# Patient Record
Sex: Female | Born: 1985 | Race: White | Hispanic: No | Marital: Married | Smoking: Never smoker
Health system: Southern US, Community
[De-identification: ages and names within clinical notes are randomized; demographics above are authoritative.]

## PROBLEM LIST (undated history)

## (undated) ENCOUNTER — Inpatient Hospital Stay (HOSPITAL_COMMUNITY): Payer: Self-pay

## (undated) DIAGNOSIS — E282 Polycystic ovarian syndrome: Secondary | ICD-10-CM

## (undated) DIAGNOSIS — O24419 Gestational diabetes mellitus in pregnancy, unspecified control: Secondary | ICD-10-CM

## (undated) DIAGNOSIS — E669 Obesity, unspecified: Secondary | ICD-10-CM

## (undated) DIAGNOSIS — O139 Gestational [pregnancy-induced] hypertension without significant proteinuria, unspecified trimester: Secondary | ICD-10-CM

## (undated) DIAGNOSIS — Z8619 Personal history of other infectious and parasitic diseases: Secondary | ICD-10-CM

## (undated) HISTORY — DX: Gestational diabetes mellitus in pregnancy, unspecified control: O24.419

## (undated) HISTORY — PX: NO PAST SURGERIES: SHX2092

## (undated) HISTORY — DX: Obesity, unspecified: E66.9

## (undated) HISTORY — DX: Personal history of other infectious and parasitic diseases: Z86.19

---
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
## 2005-03-12 ENCOUNTER — Ambulatory Visit: Payer: Self-pay | Admitting: Family Medicine

## 2005-05-21 ENCOUNTER — Ambulatory Visit: Payer: Self-pay | Admitting: Family Medicine

## 2005-08-27 ENCOUNTER — Ambulatory Visit: Payer: Self-pay | Admitting: Family Medicine

## 2007-12-28 ENCOUNTER — Encounter: Admission: RE | Admit: 2007-12-28 | Discharge: 2007-12-28 | Payer: Self-pay | Admitting: Occupational Medicine

## 2008-08-10 IMAGING — CR DG ANKLE COMPLETE 3+V*R*
3 series · 3 of 3 positions shown · non-contrast
Comparison: None available

CLINICAL DATA: FALL, INJURY

RIGHT ANKLE - COMPLETE 3+ VIEW

[view not recorded (1 of 3)]
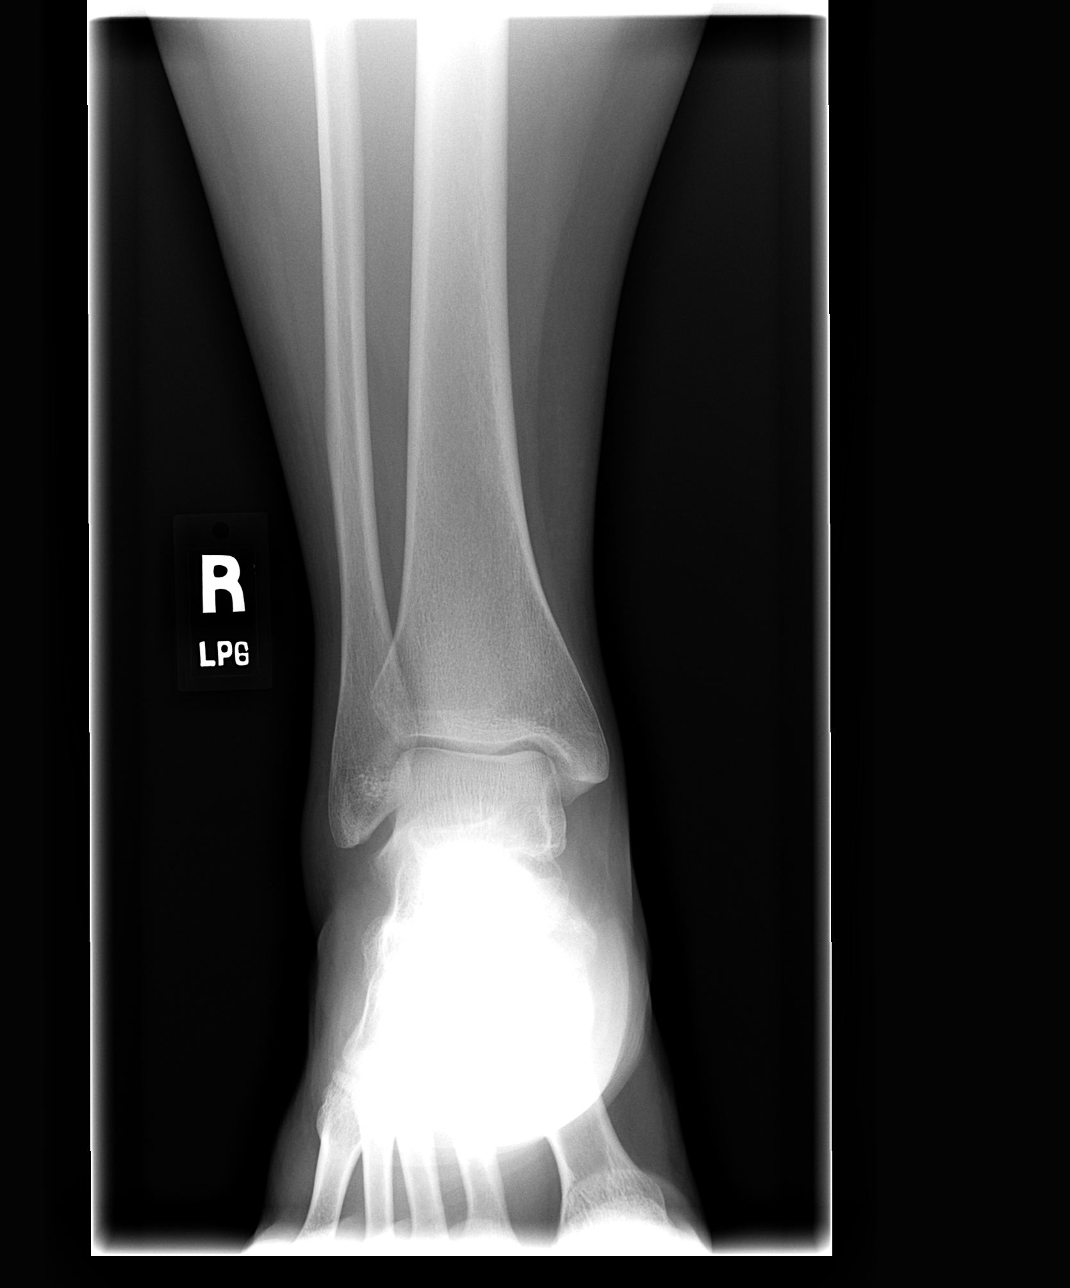

[view not recorded (2 of 3)]
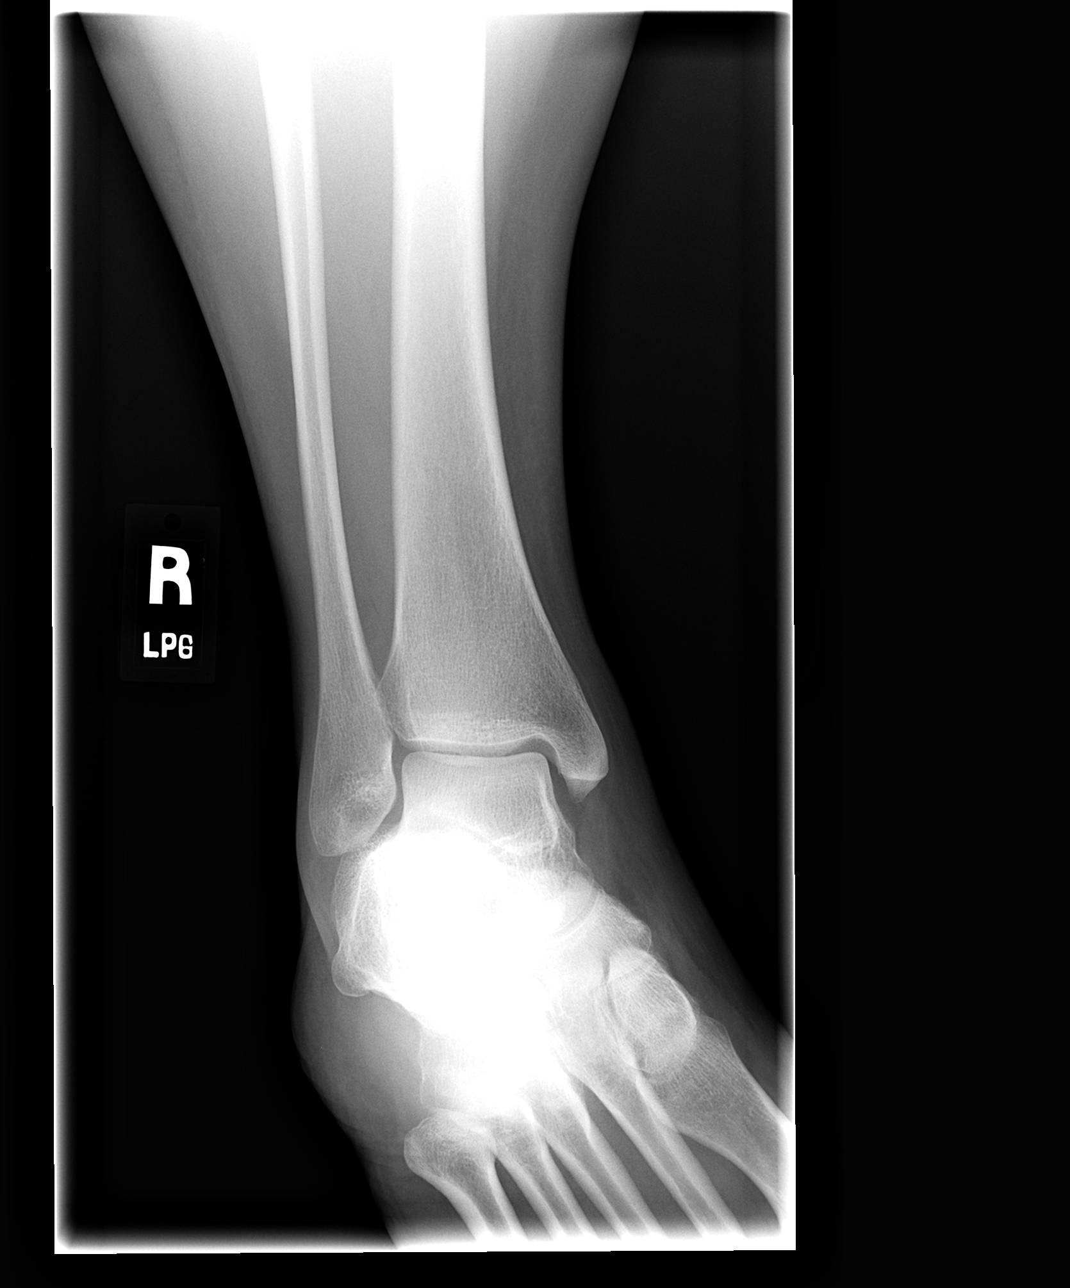

[view not recorded (3 of 3)]
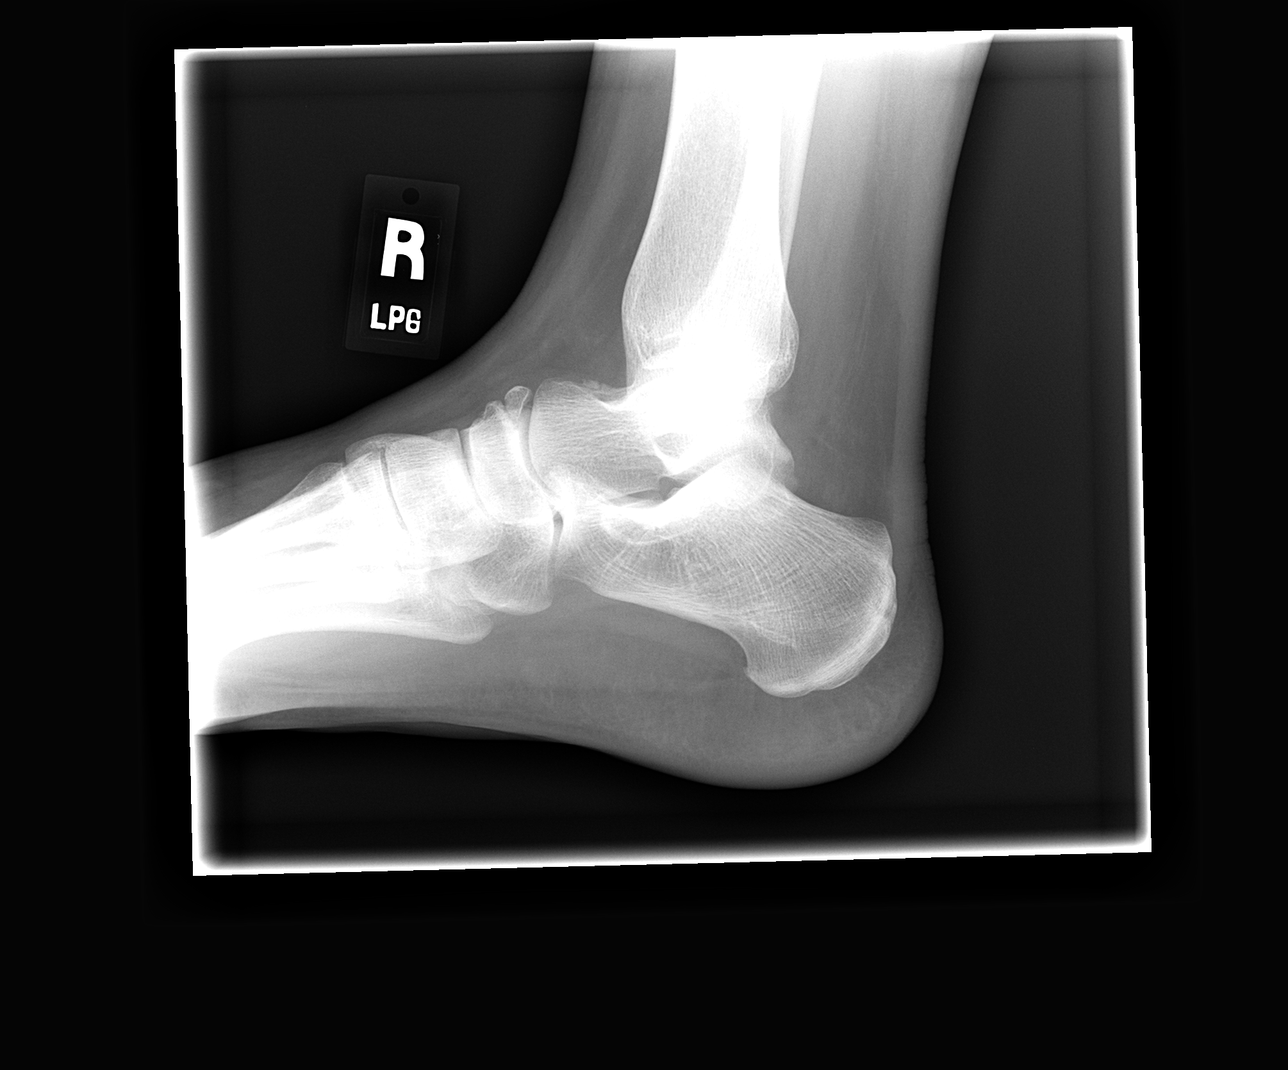

[3 of 3 positions shown; findings below may reference images not displayed]

FINDINGS: Ankle mortise congruent.  Medial and lateral malleolus
intact.

Two  abnormalities are noted on the lateral view.  The first is a
small accessory ossicle which is well corticated at the the
superior aspect of the navicular bone, consistent with os
infranaviculaire.  There is small area of bony irregularity at the
talar ridge which could represent a small capsular avulsion.
Recommend clinical correlation for site of tenderness.
IMPRESSION: 1.  Bony irregularity of the talar ridge could represent small
capsular avulsion.  Clinical correlation required.

## 2011-05-13 LAB — RPR: RPR: NONREACTIVE

## 2011-05-13 LAB — ANTIBODY SCREEN: Antibody Screen: NEGATIVE

## 2011-05-13 LAB — GC/CHLAMYDIA PROBE AMP, GENITAL: Chlamydia: NEGATIVE

## 2011-05-13 LAB — RUBELLA ANTIBODY, IGM: Rubella: IMMUNE

## 2011-05-20 ENCOUNTER — Other Ambulatory Visit: Payer: Self-pay

## 2011-06-22 ENCOUNTER — Other Ambulatory Visit (HOSPITAL_COMMUNITY): Payer: Self-pay | Admitting: Obstetrics and Gynecology

## 2011-06-22 DIAGNOSIS — Z3689 Encounter for other specified antenatal screening: Secondary | ICD-10-CM

## 2011-07-22 ENCOUNTER — Ambulatory Visit (HOSPITAL_COMMUNITY)
Admission: RE | Admit: 2011-07-22 | Discharge: 2011-07-22 | Disposition: A | Discharge status: Home / Self Care | Payer: Commercial Managed Care - PPO | Source: Ambulatory Visit | Attending: Obstetrics and Gynecology | Admitting: Obstetrics and Gynecology

## 2011-07-22 DIAGNOSIS — Z1389 Encounter for screening for other disorder: Secondary | ICD-10-CM | POA: Insufficient documentation

## 2011-07-22 DIAGNOSIS — Z363 Encounter for antenatal screening for malformations: Secondary | ICD-10-CM | POA: Insufficient documentation

## 2011-07-22 DIAGNOSIS — Z3689 Encounter for other specified antenatal screening: Secondary | ICD-10-CM

## 2011-07-22 DIAGNOSIS — O358XX0 Maternal care for other (suspected) fetal abnormality and damage, not applicable or unspecified: Secondary | ICD-10-CM | POA: Insufficient documentation

## 2011-10-05 NOTE — L&D Delivery Note (Signed)
Delivery Note At 4:12 AM a viable female was delivered via Vaginal, Spontaneous Delivery (Presentation: Right Occiput Anterior).  APGAR: 9,9 ; weight 7 lb 12 oz (3515 g).   Placenta status: spontaneous ,intact  . 3V Cord  Pt Delivered a vigorous female infant in the vertex ROA presentation weighing 7#12 with apgars of 9/9. Placenta delivered spontaneously, intact with 3VC.  No lacerations required repair.  Mother and baby are doing well after delivery.  Anesthesia: Epidural  Episiotomy: None Lacerations: None Suture Repair: None Est. Blood Loss (mL): 300 cc  Mom to postpartum.  Baby to nursery-stable.  Osbaldo Mark H. 12/16/2011, 4:36 AM

## 2011-10-20 ENCOUNTER — Encounter: Payer: Commercial Managed Care - PPO | Attending: Obstetrics and Gynecology | Admitting: *Deleted

## 2011-10-20 ENCOUNTER — Encounter: Payer: Self-pay | Admitting: *Deleted

## 2011-10-20 DIAGNOSIS — Z713 Dietary counseling and surveillance: Secondary | ICD-10-CM | POA: Insufficient documentation

## 2011-10-20 DIAGNOSIS — O99810 Abnormal glucose complicating pregnancy: Secondary | ICD-10-CM | POA: Insufficient documentation

## 2011-10-20 NOTE — Patient Instructions (Signed)
Goals:  Check glucose levels per MD as instructed  Follow Gestational Diabetes Diet as instructed  Call for follow-up as needed    

## 2011-10-20 NOTE — Progress Notes (Signed)
  Patient was seen on 10/20/2011 for Gestational Diabetes self-management class at the [facility]. The following learning objectives were met by the patient during this course:   States the definition of Gestational Diabetes  States why dietary management is important in controlling blood glucose  Describes the effects each nutrient has on blood glucose levels  Demonstrates ability to create a balanced meal plan  Demonstrates carbohydrate counting   States when to check blood glucose levels  Demonstrates proper blood glucose monitoring techniques  States the effect of stress and exercise on blood glucose levels  States the importance of limiting caffeine and abstaining from alcohol and smoking  Blood glucose monitor given: One Touch Ultra Mini Lot # [id number] Exp: 02/2012 Blood glucose reading: 136 post meal  Patient instructed to monitor glucose levels: FBS: 60 - <90 2 hour: <120  *Patient received handouts:  Nutrition Diabetes and Pregnancy  Carbohydrate Counting List  Patient will be seen for follow-up as needed.

## 2011-11-10 ENCOUNTER — Other Ambulatory Visit (HOSPITAL_COMMUNITY): Payer: Self-pay | Admitting: Obstetrics and Gynecology

## 2011-11-12 ENCOUNTER — Encounter (HOSPITAL_COMMUNITY): Payer: Self-pay

## 2011-11-12 ENCOUNTER — Other Ambulatory Visit (HOSPITAL_COMMUNITY): Payer: Self-pay | Admitting: Obstetrics and Gynecology

## 2011-11-12 ENCOUNTER — Ambulatory Visit (HOSPITAL_COMMUNITY)
Admission: RE | Admit: 2011-11-12 | Discharge: 2011-11-12 | Disposition: A | Discharge status: Home / Self Care | Payer: Commercial Managed Care - PPO | Source: Ambulatory Visit | Attending: Obstetrics and Gynecology | Admitting: Obstetrics and Gynecology

## 2011-11-12 DIAGNOSIS — E669 Obesity, unspecified: Secondary | ICD-10-CM | POA: Insufficient documentation

## 2011-11-12 DIAGNOSIS — O368390 Maternal care for abnormalities of the fetal heart rate or rhythm, unspecified trimester, not applicable or unspecified: Secondary | ICD-10-CM | POA: Insufficient documentation

## 2011-11-12 DIAGNOSIS — O99810 Abnormal glucose complicating pregnancy: Secondary | ICD-10-CM | POA: Insufficient documentation

## 2011-11-12 DIAGNOSIS — O09299 Supervision of pregnancy with other poor reproductive or obstetric history, unspecified trimester: Secondary | ICD-10-CM | POA: Insufficient documentation

## 2011-11-12 HISTORY — DX: Polycystic ovarian syndrome: E28.2

## 2011-11-12 NOTE — Progress Notes (Signed)
Ms. Varma was seen for ultrasound appointment today.  Please see [facility] report for details.

## 2011-11-18 LAB — STREP B DNA PROBE: GBS: NEGATIVE

## 2011-12-11 ENCOUNTER — Encounter (HOSPITAL_COMMUNITY): Payer: Self-pay | Admitting: *Deleted

## 2011-12-11 ENCOUNTER — Inpatient Hospital Stay (HOSPITAL_COMMUNITY)
Admission: AD | Admit: 2011-12-11 | Discharge: 2011-12-12 | Disposition: A | Discharge status: Home / Self Care | Payer: Commercial Managed Care - PPO | Attending: Obstetrics and Gynecology | Admitting: Obstetrics and Gynecology

## 2011-12-11 DIAGNOSIS — O479 False labor, unspecified: Secondary | ICD-10-CM | POA: Insufficient documentation

## 2011-12-11 HISTORY — DX: Gestational (pregnancy-induced) hypertension without significant proteinuria, unspecified trimester: O13.9

## 2011-12-11 NOTE — Progress Notes (Signed)
"  I had a doctor's appt yesterday and she told me I'm dilated 4cm already.  I started having UC's at around 1300 today.  They are about every 7 mins.  I'm not feeling very uncomforable with them.  We live over an hour away and Dr. Henderson Cloud told me to come get checked out.  No bleeding or leaking fluid, but I am starting to lose my mucous plug.  (+) FM  She hasn't moved as much as normal in the last couple of hours, but she just moved a couple of hours ago."

## 2011-12-15 ENCOUNTER — Encounter (HOSPITAL_COMMUNITY): Payer: Self-pay | Admitting: Anesthesiology

## 2011-12-15 ENCOUNTER — Encounter (HOSPITAL_COMMUNITY): Payer: Self-pay

## 2011-12-15 ENCOUNTER — Inpatient Hospital Stay (HOSPITAL_COMMUNITY): Payer: Commercial Managed Care - PPO | Admitting: Anesthesiology

## 2011-12-15 ENCOUNTER — Encounter (HOSPITAL_COMMUNITY): Payer: Self-pay | Admitting: *Deleted

## 2011-12-15 ENCOUNTER — Inpatient Hospital Stay (HOSPITAL_COMMUNITY)
Admission: RE | Admit: 2011-12-15 | Discharge: 2011-12-18 | DRG: 775 | Disposition: A | Discharge status: Home / Self Care | Payer: Commercial Managed Care - PPO | Source: Ambulatory Visit | Attending: Obstetrics and Gynecology | Admitting: Obstetrics and Gynecology

## 2011-12-15 ENCOUNTER — Telehealth (HOSPITAL_COMMUNITY): Payer: Self-pay | Admitting: *Deleted

## 2011-12-15 DIAGNOSIS — E669 Obesity, unspecified: Secondary | ICD-10-CM | POA: Diagnosis present

## 2011-12-15 DIAGNOSIS — O99814 Abnormal glucose complicating childbirth: Principal | ICD-10-CM | POA: Diagnosis present

## 2011-12-15 LAB — CBC
MCH: 25.3 pg — ABNORMAL LOW (ref 26.0–34.0)
MCV: 78.2 fL (ref 78.0–100.0)
Platelets: 229 10*3/uL (ref 150–400)
RDW: 13.8 % (ref 11.5–15.5)
WBC: 10.3 10*3/uL (ref 4.0–10.5)

## 2011-12-15 MED ORDER — EPHEDRINE 5 MG/ML INJ: 10.0000 mg | INTRAVENOUS | Status: DC | PRN

## 2011-12-15 MED ORDER — LACTATED RINGERS IV SOLN: 500.0000 mL | Freq: Once | INTRAVENOUS | Status: DC

## 2011-12-15 MED ORDER — FENTANYL 2.5 MCG/ML BUPIVACAINE 1/10 % EPIDURAL INFUSION (WH - ANES)
INTRAMUSCULAR | Status: DC | PRN
  Administered 2011-12-15: 14 mL/h via EPIDURAL

## 2011-12-15 MED ORDER — CITRIC ACID-SODIUM CITRATE 334-500 MG/5ML PO SOLN: 30.0000 mL | ORAL | Status: DC | PRN

## 2011-12-15 MED ORDER — PHENYLEPHRINE 40 MCG/ML (10ML) SYRINGE FOR IV PUSH (FOR BLOOD PRESSURE SUPPORT)
80.0000 ug | PREFILLED_SYRINGE | INTRAVENOUS | Status: DC | PRN
  Filled 2011-12-15: qty 5

## 2011-12-15 MED ORDER — FENTANYL 2.5 MCG/ML BUPIVACAINE 1/10 % EPIDURAL INFUSION (WH - ANES)
14.0000 mL/h | INTRAMUSCULAR | Status: DC
  Administered 2011-12-16 (×2): 14 mL/h via EPIDURAL
  Filled 2011-12-15 (×3): qty 60

## 2011-12-15 MED ORDER — OXYCODONE-ACETAMINOPHEN 5-325 MG PO TABS: 1.0000 | ORAL_TABLET | ORAL | Status: DC | PRN

## 2011-12-15 MED ORDER — OXYTOCIN 20 UNITS IN LACTATED RINGERS INFUSION - SIMPLE
1.0000 m[IU]/min | INTRAVENOUS | Status: DC
  Administered 2011-12-15: 2 m[IU]/min via INTRAVENOUS
  Filled 2011-12-15: qty 1000

## 2011-12-15 MED ORDER — LIDOCAINE-EPINEPHRINE (PF) 2 %-1:200000 IJ SOLN
INTRAMUSCULAR | Status: DC | PRN
  Administered 2011-12-15: 4 mL via EPIDURAL

## 2011-12-15 MED ORDER — LIDOCAINE HCL (PF) 1 % IJ SOLN: 30.0000 mL | INTRAMUSCULAR | Status: DC | PRN

## 2011-12-15 MED ORDER — OXYTOCIN BOLUS FROM INFUSION
500.0000 mL | Freq: Once | INTRAVENOUS | Status: DC
  Filled 2011-12-15: qty 500

## 2011-12-15 MED ORDER — DIPHENHYDRAMINE HCL 50 MG/ML IJ SOLN
12.5000 mg | INTRAMUSCULAR | Status: DC | PRN
  Administered 2011-12-16: 12.5 mg via INTRAVENOUS
  Filled 2011-12-15: qty 1

## 2011-12-15 MED ORDER — TERBUTALINE SULFATE 1 MG/ML IJ SOLN: 0.2500 mg | Freq: Once | INTRAMUSCULAR | Status: AC | PRN

## 2011-12-15 MED ORDER — LACTATED RINGERS IV SOLN
INTRAVENOUS | Status: DC
  Administered 2011-12-15 – 2011-12-16 (×2): via INTRAVENOUS

## 2011-12-15 MED ORDER — FLEET ENEMA 7-19 GM/118ML RE ENEM: 1.0000 | ENEMA | RECTAL | Status: DC | PRN

## 2011-12-15 MED ORDER — EPHEDRINE 5 MG/ML INJ
10.0000 mg | INTRAVENOUS | Status: DC | PRN
  Filled 2011-12-15 (×2): qty 4

## 2011-12-15 MED ORDER — LACTATED RINGERS IV SOLN: 500.0000 mL | INTRAVENOUS | Status: DC | PRN

## 2011-12-15 MED ORDER — ONDANSETRON HCL 4 MG/2ML IJ SOLN
4.0000 mg | Freq: Four times a day (QID) | INTRAMUSCULAR | Status: DC | PRN
  Administered 2011-12-15: 4 mg via INTRAVENOUS
  Filled 2011-12-15: qty 2

## 2011-12-15 MED ORDER — OXYTOCIN 20 UNITS IN LACTATED RINGERS INFUSION - SIMPLE
125.0000 mL/h | Freq: Once | INTRAVENOUS | Status: AC
  Administered 2011-12-16: 999 mL/h via INTRAVENOUS

## 2011-12-15 MED ORDER — IBUPROFEN 600 MG PO TABS: 600.0000 mg | ORAL_TABLET | Freq: Four times a day (QID) | ORAL | Status: DC | PRN

## 2011-12-15 MED ORDER — ACETAMINOPHEN 325 MG PO TABS: 650.0000 mg | ORAL_TABLET | ORAL | Status: DC | PRN

## 2011-12-15 NOTE — Anesthesia Preprocedure Evaluation (Addendum)
Anesthesia Evaluation  Patient identified by MRN, date of birth, ID band Patient awake    Reviewed: Allergy & Precautions, H&P , Patient's Chart, lab work & pertinent test results  Airway Mallampati: II TM Distance: >3 FB Neck ROM: full    Dental  (+) Teeth Intact   Pulmonary  breath sounds clear to auscultation        Cardiovascular Rhythm:regular Rate:Normal     Neuro/Psych    GI/Hepatic   Endo/Other  Diabetes mellitus-, GestationalMorbid obesity  Renal/GU      Musculoskeletal   Abdominal   Peds  Hematology   Anesthesia Other Findings       Reproductive/Obstetrics (+) Pregnancy                          Anesthesia Physical Anesthesia Plan  ASA: III  Anesthesia Plan: Epidural   Post-op Pain Management:    Induction:   Airway Management Planned:   Additional Equipment:   Intra-op Plan:   Post-operative Plan:   Informed Consent: I have reviewed the patients History and Physical, chart, labs and discussed the procedure including the risks, benefits and alternatives for the proposed anesthesia with the patient or authorized representative who has indicated his/her understanding and acceptance.   Dental Advisory Given  Plan Discussed with:   Anesthesia Plan Comments: (Labs checked- platelets confirmed with RN in room. Fetal heart tracing, per RN, reported to be stable enough for sitting procedure. Discussed epidural, and patient consents to the procedure:  included risk of possible headache,backache, failed block, allergic reaction, and nerve injury. This patient was asked if she had any questions or concerns before the procedure started. )        Anesthesia Quick Evaluation

## 2011-12-15 NOTE — Progress Notes (Signed)
Chair without wheels offered to support person during epidural placement and declined.

## 2011-12-15 NOTE — H&P (Signed)
  Admission H&P  CC: IOL HPI: 26 year old G2 P1001 presents at 38 weeks +6 days for a scheduled induction of labor. The patient is an A2 gestational diabetic has been controlled on glyburide throughout this pregnancy.  She presents this evening for a scheduled induction of labor for gestational diabetes. Additionally her pregnancy has been complicated by maternal obesity with a early pregnancy BMI of 41.6. At 33 weeks during this pregnancy she was noted to have a fetal arrhythmia. The patient was referred to maternal fetal medicine. An ultrasound at that time showed normal fetal anatomy with no evidence of fetal arrhythmia. She has had no further issues with this fetal arrhythmia since that evaluation.  She's been followed with serial growth ultrasounds and nonstress test. Her most recent estimated fetal weight is 75th percentile.  PMH: 1) PCOS with oligoovulation requiring clomid + metformin 2) Obesity PSH: None POBGYN: G2P1001  2007: 37 week svd female 6#14, Induction for PIH Meds: 1) Glyburide 2) PNV All: Stadol SH: Neg x 3 PNL: blood type O+, antibody screen negative, RPR nonreactive, rubella titer immune, hepatitis B surface antigen negative, HIV nonreactive, Pap smear within normal limits, gonorrhea negative, Chlamydia negative, one-hour Glucola 142, three-hour Glucola 90, 199, 139, 184. GBS negative. Declines genetic screening. PE: CBG 110 Filed Vitals:   12/15/11 2138 12/15/11 2139 12/15/11 2140 12/15/11 2141  BP: 144/125 148/79 125/109   Pulse: 99 99 100 104  Temp:      TempSrc:      Resp:      Height:      Weight:       AOX3, NAD Gravid, soft, NT Dilation: 4 Effacement (%): 50 Cervical Position: Posterior Station: -2 Presentation: Vertex Exam by:: Bennye Alm, RN FHT:140s reactive, with accelerations, and no decelerations Toco: Irregular  A/P 1) Admit 2) Place epidural 3) AROM/Pitocin 4) Accuchecks Q4 while in latent labor, Q1 in active labor

## 2011-12-15 NOTE — Anesthesia Procedure Notes (Signed)
Epidural Patient location during procedure: OB  Preanesthetic Checklist Completed: patient identified, site marked, surgical consent, pre-op evaluation, timeout performed, IV checked, risks and benefits discussed and monitors and equipment checked  Epidural Patient position: sitting Prep: site prepped and draped and DuraPrep Patient monitoring: continuous pulse ox and blood pressure Approach: midline Injection technique: LOR air  Needle:  Needle type: Tuohy  Needle gauge: 17 G Needle length: 9 cm Needle insertion depth: 9 cm Catheter type: closed end flexible Catheter size: 19 Gauge Test dose: negative  Assessment Events: blood not aspirated, injection not painful, no injection resistance, negative IV test and no paresthesia  Additional Notes Dosing of Epidural:  1st dose, through needle ............................................. epi 1:200K + Xylocaine 40 mg  2nd dose, through catheter, after waiting 3 minutes.....epi 1:200K + Xylocaine 40 mg  3rd dose, through catheter after waiting 3 minutes .............................Marcaine   4mg   ( mg Marcaine are expressed as equivilent  cc's medication removed from the 0.1%Bupiv / fentanyl syringe from L&D pump)  ( 2% Xylo charted as a single dose in Epic Meds for ease of charting; actual dosing was fractionated as above, for saftey's sake)  As each dose occurred, patient was free of IV sx; and patient exhibited no evidence of SA injection.  Patient is more comfortable after epidural dosed. Please see RN's note for documentation of vital signs,and FHR which are stable.    

## 2011-12-15 NOTE — Telephone Encounter (Signed)
Preadmission screen  

## 2011-12-15 NOTE — Progress Notes (Signed)
Patient ID: Patricia Bailey, female   DOB: 09/11/1986, 26 y.o.   MRN: [id number]  Labor PN  S: Comfortable after epidural O:  Filed Vitals:   12/15/11 2141 12/15/11 2144 12/15/11 2145 12/15/11 2146  BP:   140/104 155/86  Pulse: 104 96 97 103  Temp:      TempSrc:      Resp:      Height:      Weight:       Cervix 4+/70/-2 soft FHT 145 + accels, no decels, reactive toco irregular q 2-3  AROM clear  A/P 1) Cont pit 2) FWB reassuring

## 2011-12-16 ENCOUNTER — Encounter (HOSPITAL_COMMUNITY): Payer: Self-pay

## 2011-12-16 LAB — GLUCOSE, CAPILLARY: Glucose-Capillary: 113 mg/dL — ABNORMAL HIGH (ref 70–99)

## 2011-12-16 LAB — ABO/RH: ABO/RH(D): O POS

## 2011-12-16 MED ORDER — ZOLPIDEM TARTRATE 5 MG PO TABS: 5.0000 mg | ORAL_TABLET | Freq: Every evening | ORAL | Status: DC | PRN

## 2011-12-16 MED ORDER — BENZOCAINE-MENTHOL 20-0.5 % EX AERO
INHALATION_SPRAY | CUTANEOUS | Status: AC
  Filled 2011-12-16: qty 56

## 2011-12-16 MED ORDER — IBUPROFEN 600 MG PO TABS
600.0000 mg | ORAL_TABLET | Freq: Four times a day (QID) | ORAL | Status: DC
  Administered 2011-12-16 – 2011-12-18 (×9): 600 mg via ORAL
  Filled 2011-12-16 (×8): qty 1

## 2011-12-16 MED ORDER — ONDANSETRON HCL 4 MG PO TABS
4.0000 mg | ORAL_TABLET | ORAL | Status: DC | PRN
  Administered 2011-12-17 (×2): 4 mg via ORAL
  Filled 2011-12-16 (×2): qty 1

## 2011-12-16 MED ORDER — SENNOSIDES-DOCUSATE SODIUM 8.6-50 MG PO TABS
2.0000 | ORAL_TABLET | Freq: Every day | ORAL | Status: DC
  Administered 2011-12-16 – 2011-12-17 (×2): 2 via ORAL

## 2011-12-16 MED ORDER — BENZOCAINE-MENTHOL 20-0.5 % EX AERO
1.0000 "application " | INHALATION_SPRAY | CUTANEOUS | Status: DC | PRN
  Administered 2011-12-16: 1 via TOPICAL

## 2011-12-16 MED ORDER — DIBUCAINE 1 % RE OINT: 1.0000 "application " | TOPICAL_OINTMENT | RECTAL | Status: DC | PRN

## 2011-12-16 MED ORDER — ONDANSETRON HCL 4 MG/2ML IJ SOLN: 4.0000 mg | INTRAMUSCULAR | Status: DC | PRN

## 2011-12-16 MED ORDER — WITCH HAZEL-GLYCERIN EX PADS: 1.0000 "application " | MEDICATED_PAD | CUTANEOUS | Status: DC | PRN

## 2011-12-16 MED ORDER — OXYCODONE-ACETAMINOPHEN 5-325 MG PO TABS
1.0000 | ORAL_TABLET | ORAL | Status: DC | PRN
  Administered 2011-12-16 – 2011-12-17 (×5): 1 via ORAL
  Administered 2011-12-17 (×2): 2 via ORAL
  Administered 2011-12-17: 1 via ORAL
  Filled 2011-12-16: qty 2
  Filled 2011-12-16: qty 1
  Filled 2011-12-16: qty 2
  Filled 2011-12-16 (×5): qty 1

## 2011-12-16 MED ORDER — PRENATAL MULTIVITAMIN CH
1.0000 | ORAL_TABLET | Freq: Every day | ORAL | Status: DC
  Administered 2011-12-18: 1 via ORAL
  Filled 2011-12-16 (×2): qty 1

## 2011-12-16 MED ORDER — LANOLIN HYDROUS EX OINT: TOPICAL_OINTMENT | CUTANEOUS | Status: DC | PRN

## 2011-12-16 MED ORDER — TETANUS-DIPHTH-ACELL PERTUSSIS 5-2.5-18.5 LF-MCG/0.5 IM SUSP: 0.5000 mL | Freq: Once | INTRAMUSCULAR | Status: DC

## 2011-12-16 MED ORDER — SIMETHICONE 80 MG PO CHEW: 80.0000 mg | CHEWABLE_TABLET | ORAL | Status: DC | PRN

## 2011-12-16 MED ORDER — DIPHENHYDRAMINE HCL 25 MG PO CAPS: 25.0000 mg | ORAL_CAPSULE | Freq: Four times a day (QID) | ORAL | Status: DC | PRN

## 2011-12-16 NOTE — Progress Notes (Signed)
Dr. Tenny Craw contacted about UC pattern and inability to trace UC's.  FHR remains reactive and reassuring.  Order received to continue increasing pitocin as long as FHR remains reactive.

## 2011-12-16 NOTE — Progress Notes (Signed)
Both Mom and Baby stable.  Breast feed.

## 2011-12-17 LAB — CBC
Platelets: 194 10*3/uL (ref 150–400)
RBC: 3.61 MIL/uL — ABNORMAL LOW (ref 3.87–5.11)
RDW: 13.8 % (ref 11.5–15.5)
WBC: 7.3 10*3/uL (ref 4.0–10.5)

## 2011-12-17 NOTE — Addendum Note (Signed)
Addendum  created 12/17/11 1441 by Graciela Husbands, CRNA   Modules edited:Charges VN, Notes Section

## 2011-12-17 NOTE — Progress Notes (Signed)
Patient is eating, ambulating, voiding.  Pain control is good.  Filed Vitals:   12/16/11 1858 12/16/11 2130 12/17/11 0533 12/17/11 0539  BP: 121/81 116/80 101/61 113/71  Pulse: 83 76 57 74  Temp: 98.7 F (37.1 C) 97.6 F (36.4 C) 97.8 F (36.6 C) 97.8 F (36.6 C)  TempSrc: Oral Oral Oral Oral  Resp: 16 18 18 18   Height:      Weight:        Fundus firm Perineum without swelling.  Lab Results  Component Value Date   WBC 7.3 12/17/2011   HGB 9.0* 12/17/2011   HCT 28.4* 12/17/2011   MCV 78.7 12/17/2011   PLT 194 12/17/2011    --/--/O POS (03/13 2015)  A/P Post partum day 1. Pt doing well, desires to stay until tomorrow.  Routine care.  Expect d/c tomorrow.    Patricia Bailey

## 2011-12-17 NOTE — Anesthesia Postprocedure Evaluation (Signed)
  Anesthesia Post-op Note  Patient: Patricia Bailey  Procedure(s) Performed: * No procedures listed *  Patient Location: 137  Anesthesia Type: Epidural  Level of Consciousness: awake, alert  and oriented  Airway and Oxygen Therapy: Patient Spontanous Breathing  Post-op Pain: mild  Post-op Assessment: Post-op Vital signs reviewed, Patient's Cardiovascular Status Stable, No headache, No backache, No residual numbness and No residual motor weakness  Post-op Vital Signs: Reviewed and stable  Complications: No apparent anesthesia complications

## 2011-12-17 NOTE — Addendum Note (Signed)
Addendum  created 12/17/11 1440 by Graciela Husbands, CRNA   Modules edited:Charges VN, Notes Section

## 2011-12-18 NOTE — Discharge Summary (Signed)
Obstetric Discharge Summary Reason for Admission: induction of labor Prenatal Procedures: none Intrapartum Procedures: spontaneous vaginal delivery Postpartum Procedures: none Complications-Operative and Postpartum: none Hemoglobin  Date Value Range Status  12/17/2011 9.0* 12.0-15.0 (g/dL) Final     REPEATED TO VERIFY     DELTA CHECK NOTED     HCT  Date Value Range Status  12/17/2011 28.4* 36.0-46.0 (%) Final    Physical Exam:  General: alert and cooperative Lochia: appropriate Uterine Fundus: firm Incision: deferred DVT Evaluation: No evidence of DVT seen on physical exam.  Discharge Diagnoses: Term Pregnancy-delivered  Discharge Information: Date: 12/18/2011 Activity: pelvic rest Diet: routine Medications: PNV and Ibuprofen Condition: stable Instructions: refer to practice specific booklet Discharge to: home Follow-up Information    Follow up with Almon Hercules., MD in 4 weeks.   Contact information:   [address] [place] [address] [phone]          Newborn Data: Live born female  Birth Weight: 7 lb 12 oz (3515 g) APGAR: 9, 9  Home with mother.  Philip Aspen 12/18/2011, 10:32 AM

## 2014-08-05 ENCOUNTER — Encounter (HOSPITAL_COMMUNITY): Payer: Self-pay

## 2018-06-01 DIAGNOSIS — F4321 Adjustment disorder with depressed mood: Secondary | ICD-10-CM | POA: Diagnosis not present

## 2018-06-12 DIAGNOSIS — F4321 Adjustment disorder with depressed mood: Secondary | ICD-10-CM | POA: Diagnosis not present

## 2018-06-22 DIAGNOSIS — F4321 Adjustment disorder with depressed mood: Secondary | ICD-10-CM | POA: Diagnosis not present

## 2018-07-06 DIAGNOSIS — F4321 Adjustment disorder with depressed mood: Secondary | ICD-10-CM | POA: Diagnosis not present

## 2018-07-24 DIAGNOSIS — Z6841 Body Mass Index (BMI) 40.0 and over, adult: Secondary | ICD-10-CM | POA: Diagnosis not present

## 2018-07-24 DIAGNOSIS — F4321 Adjustment disorder with depressed mood: Secondary | ICD-10-CM | POA: Diagnosis not present

## 2018-07-24 DIAGNOSIS — Z124 Encounter for screening for malignant neoplasm of cervix: Secondary | ICD-10-CM | POA: Diagnosis not present

## 2018-07-24 DIAGNOSIS — Z01419 Encounter for gynecological examination (general) (routine) without abnormal findings: Secondary | ICD-10-CM | POA: Diagnosis not present

## 2018-08-07 DIAGNOSIS — F4321 Adjustment disorder with depressed mood: Secondary | ICD-10-CM | POA: Diagnosis not present

## 2018-08-24 DIAGNOSIS — F4321 Adjustment disorder with depressed mood: Secondary | ICD-10-CM | POA: Diagnosis not present

## 2018-11-08 DIAGNOSIS — R6889 Other general symptoms and signs: Secondary | ICD-10-CM | POA: Diagnosis not present

## 2019-04-20 DIAGNOSIS — F4321 Adjustment disorder with depressed mood: Secondary | ICD-10-CM | POA: Diagnosis not present

## 2019-05-11 DIAGNOSIS — F4321 Adjustment disorder with depressed mood: Secondary | ICD-10-CM | POA: Diagnosis not present

## 2019-05-16 DIAGNOSIS — F4321 Adjustment disorder with depressed mood: Secondary | ICD-10-CM | POA: Diagnosis not present

## 2019-05-30 DIAGNOSIS — F4321 Adjustment disorder with depressed mood: Secondary | ICD-10-CM | POA: Diagnosis not present

## 2019-06-14 DIAGNOSIS — F4321 Adjustment disorder with depressed mood: Secondary | ICD-10-CM | POA: Diagnosis not present

## 2019-06-28 DIAGNOSIS — M9903 Segmental and somatic dysfunction of lumbar region: Secondary | ICD-10-CM | POA: Diagnosis not present

## 2019-06-28 DIAGNOSIS — M545 Low back pain: Secondary | ICD-10-CM | POA: Diagnosis not present

## 2019-06-28 DIAGNOSIS — M62830 Muscle spasm of back: Secondary | ICD-10-CM | POA: Diagnosis not present

## 2019-06-28 DIAGNOSIS — M9905 Segmental and somatic dysfunction of pelvic region: Secondary | ICD-10-CM | POA: Diagnosis not present

## 2019-07-03 DIAGNOSIS — M62830 Muscle spasm of back: Secondary | ICD-10-CM | POA: Diagnosis not present

## 2019-07-03 DIAGNOSIS — M9905 Segmental and somatic dysfunction of pelvic region: Secondary | ICD-10-CM | POA: Diagnosis not present

## 2019-07-03 DIAGNOSIS — M545 Low back pain: Secondary | ICD-10-CM | POA: Diagnosis not present

## 2019-07-03 DIAGNOSIS — M9903 Segmental and somatic dysfunction of lumbar region: Secondary | ICD-10-CM | POA: Diagnosis not present

## 2019-07-10 DIAGNOSIS — M9905 Segmental and somatic dysfunction of pelvic region: Secondary | ICD-10-CM | POA: Diagnosis not present

## 2019-07-10 DIAGNOSIS — M545 Low back pain: Secondary | ICD-10-CM | POA: Diagnosis not present

## 2019-07-10 DIAGNOSIS — M62830 Muscle spasm of back: Secondary | ICD-10-CM | POA: Diagnosis not present

## 2019-07-10 DIAGNOSIS — M9903 Segmental and somatic dysfunction of lumbar region: Secondary | ICD-10-CM | POA: Diagnosis not present

## 2019-08-24 DIAGNOSIS — Z20828 Contact with and (suspected) exposure to other viral communicable diseases: Secondary | ICD-10-CM | POA: Diagnosis not present

## 2019-10-05 NOTE — L&D Delivery Note (Signed)
Delivery Note:   [id number] at [redacted]w[redacted]d  Admitting diagnosis: Encounter for induction of labor [Z34.90] Risks: A2GDM on Lantus, GHTN Onset of labor: 0223 IOL/Augmentation: AROM and Pitocin AROM at 0223 for copious clear fluid  Complete dilation at 07/19/2020  0523 Onset of pushing at 0525 FHR second stage Category 2,   Analgesia Eliezer Lofts intrapartum:Epidural  Pushing in lithotomy position with CNM and L&D staff support, spouse [name] present for birth and supportive.  Delivery of a Live born female  Birth Weight: 7 lb 6 oz (3345 g) APGAR: 9, 9  Newborn Delivery   Birth date/time: 07/19/2020 05:37:00 Delivery type: Vaginal, Spontaneous      in cephalic presentation, position ROA to ROT and body easily followed.  APGAR:1 min-9 , 5 min-9  10 min-  Nuchal Cord: No  Cord double clamped after cessation of pulsation, cut by FOB.  Collection of cord blood for typing completed. Cord blood donation-None  Arterial cord blood sample-No    Placenta delivered-Spontaneous  with 3 vessels . Uterotonics: Pitocin Placenta to hospital disposal. Uterine tone firm bleeding stable  Left labial splay hemostatic and not repaired  Episiotomy:None    Est. Blood Loss (mL):200.00   Complications: None  At 24 minutes of life, baby started having mild retractions and congestion. Baby was brought to warmer for assessment. O2 sats were 94-97 on room air.  Bulb suctioning performed with back PT and Delee suctioning performed by RN.  I requested neonatology to come assess.  Dr. Cleatis Polka assessed newborn and he was cleared to stay with mom.  See notes for details.    Mom to postpartum.  Baby to Couplet care / Skin to Skin.  Delivery Report:  Review the Delivery Report for details.     Signed: Karena Bailey, CNM, MSN 07/19/2020, 6:22 AM

## 2019-11-06 DIAGNOSIS — Z01419 Encounter for gynecological examination (general) (routine) without abnormal findings: Secondary | ICD-10-CM | POA: Diagnosis not present

## 2019-11-06 DIAGNOSIS — Z6841 Body Mass Index (BMI) 40.0 and over, adult: Secondary | ICD-10-CM | POA: Diagnosis not present

## 2019-11-16 DIAGNOSIS — Z6839 Body mass index (BMI) 39.0-39.9, adult: Secondary | ICD-10-CM | POA: Diagnosis not present

## 2019-11-16 DIAGNOSIS — Z1331 Encounter for screening for depression: Secondary | ICD-10-CM | POA: Diagnosis not present

## 2019-11-16 DIAGNOSIS — Z13220 Encounter for screening for lipoid disorders: Secondary | ICD-10-CM | POA: Diagnosis not present

## 2019-11-16 DIAGNOSIS — Z0000 Encounter for general adult medical examination without abnormal findings: Secondary | ICD-10-CM | POA: Diagnosis not present

## 2019-11-16 DIAGNOSIS — Z2821 Immunization not carried out because of patient refusal: Secondary | ICD-10-CM | POA: Diagnosis not present

## 2019-11-29 DIAGNOSIS — Z3689 Encounter for other specified antenatal screening: Secondary | ICD-10-CM | POA: Diagnosis not present

## 2019-11-29 DIAGNOSIS — Z3200 Encounter for pregnancy test, result unknown: Secondary | ICD-10-CM | POA: Diagnosis not present

## 2019-12-07 DIAGNOSIS — N911 Secondary amenorrhea: Secondary | ICD-10-CM | POA: Diagnosis not present

## 2019-12-07 DIAGNOSIS — R1030 Lower abdominal pain, unspecified: Secondary | ICD-10-CM | POA: Diagnosis not present

## 2019-12-07 DIAGNOSIS — R110 Nausea: Secondary | ICD-10-CM | POA: Diagnosis not present

## 2019-12-28 DIAGNOSIS — Z3201 Encounter for pregnancy test, result positive: Secondary | ICD-10-CM | POA: Diagnosis not present

## 2020-01-14 DIAGNOSIS — Z3480 Encounter for supervision of other normal pregnancy, unspecified trimester: Secondary | ICD-10-CM | POA: Diagnosis not present

## 2020-01-14 DIAGNOSIS — Z3481 Encounter for supervision of other normal pregnancy, first trimester: Secondary | ICD-10-CM | POA: Diagnosis not present

## 2020-01-14 DIAGNOSIS — Z3401 Encounter for supervision of normal first pregnancy, first trimester: Secondary | ICD-10-CM | POA: Diagnosis not present

## 2020-01-14 DIAGNOSIS — Z3689 Encounter for other specified antenatal screening: Secondary | ICD-10-CM | POA: Diagnosis not present

## 2020-01-14 DIAGNOSIS — Z8759 Personal history of other complications of pregnancy, childbirth and the puerperium: Secondary | ICD-10-CM | POA: Diagnosis not present

## 2020-01-14 DIAGNOSIS — Z118 Encounter for screening for other infectious and parasitic diseases: Secondary | ICD-10-CM | POA: Diagnosis not present

## 2020-01-14 LAB — OB RESULTS CONSOLE HEPATITIS B SURFACE ANTIGEN: Hepatitis B Surface Ag: NEGATIVE

## 2020-01-14 LAB — OB RESULTS CONSOLE RPR: RPR: NONREACTIVE

## 2020-01-14 LAB — OB RESULTS CONSOLE HIV ANTIBODY (ROUTINE TESTING): HIV: NONREACTIVE

## 2020-01-14 LAB — OB RESULTS CONSOLE RUBELLA ANTIBODY, IGM: Rubella: IMMUNE

## 2020-02-11 DIAGNOSIS — Z3689 Encounter for other specified antenatal screening: Secondary | ICD-10-CM | POA: Diagnosis not present

## 2020-02-11 DIAGNOSIS — Z3482 Encounter for supervision of other normal pregnancy, second trimester: Secondary | ICD-10-CM | POA: Diagnosis not present

## 2020-02-11 DIAGNOSIS — Z8759 Personal history of other complications of pregnancy, childbirth and the puerperium: Secondary | ICD-10-CM | POA: Diagnosis not present

## 2020-02-11 DIAGNOSIS — O2341 Unspecified infection of urinary tract in pregnancy, first trimester: Secondary | ICD-10-CM | POA: Diagnosis not present

## 2020-03-05 DIAGNOSIS — Z3A17 17 weeks gestation of pregnancy: Secondary | ICD-10-CM | POA: Diagnosis not present

## 2020-03-05 DIAGNOSIS — O24414 Gestational diabetes mellitus in pregnancy, insulin controlled: Secondary | ICD-10-CM | POA: Diagnosis not present

## 2020-03-05 DIAGNOSIS — O99212 Obesity complicating pregnancy, second trimester: Secondary | ICD-10-CM | POA: Diagnosis not present

## 2020-03-17 DIAGNOSIS — Z3A19 19 weeks gestation of pregnancy: Secondary | ICD-10-CM | POA: Diagnosis not present

## 2020-03-17 DIAGNOSIS — Z363 Encounter for antenatal screening for malformations: Secondary | ICD-10-CM | POA: Diagnosis not present

## 2020-03-17 DIAGNOSIS — O24414 Gestational diabetes mellitus in pregnancy, insulin controlled: Secondary | ICD-10-CM | POA: Diagnosis not present

## 2020-03-17 DIAGNOSIS — Z361 Encounter for antenatal screening for raised alphafetoprotein level: Secondary | ICD-10-CM | POA: Diagnosis not present

## 2020-03-17 DIAGNOSIS — O99212 Obesity complicating pregnancy, second trimester: Secondary | ICD-10-CM | POA: Diagnosis not present

## 2020-04-02 DIAGNOSIS — Z3A21 21 weeks gestation of pregnancy: Secondary | ICD-10-CM | POA: Diagnosis not present

## 2020-04-02 DIAGNOSIS — O24414 Gestational diabetes mellitus in pregnancy, insulin controlled: Secondary | ICD-10-CM | POA: Diagnosis not present

## 2020-04-16 DIAGNOSIS — O24414 Gestational diabetes mellitus in pregnancy, insulin controlled: Secondary | ICD-10-CM | POA: Diagnosis not present

## 2020-04-16 DIAGNOSIS — Z3A23 23 weeks gestation of pregnancy: Secondary | ICD-10-CM | POA: Diagnosis not present

## 2020-04-16 DIAGNOSIS — Z362 Encounter for other antenatal screening follow-up: Secondary | ICD-10-CM | POA: Diagnosis not present

## 2020-05-20 DIAGNOSIS — O2340 Unspecified infection of urinary tract in pregnancy, unspecified trimester: Secondary | ICD-10-CM | POA: Diagnosis not present

## 2020-05-20 DIAGNOSIS — Z3689 Encounter for other specified antenatal screening: Secondary | ICD-10-CM | POA: Diagnosis not present

## 2020-05-20 DIAGNOSIS — Z3A28 28 weeks gestation of pregnancy: Secondary | ICD-10-CM | POA: Diagnosis not present

## 2020-05-20 DIAGNOSIS — O24414 Gestational diabetes mellitus in pregnancy, insulin controlled: Secondary | ICD-10-CM | POA: Diagnosis not present

## 2020-05-20 DIAGNOSIS — Z8759 Personal history of other complications of pregnancy, childbirth and the puerperium: Secondary | ICD-10-CM | POA: Diagnosis not present

## 2020-06-02 DIAGNOSIS — O24414 Gestational diabetes mellitus in pregnancy, insulin controlled: Secondary | ICD-10-CM | POA: Diagnosis not present

## 2020-06-02 DIAGNOSIS — Z3A30 30 weeks gestation of pregnancy: Secondary | ICD-10-CM | POA: Diagnosis not present

## 2020-06-02 DIAGNOSIS — Z23 Encounter for immunization: Secondary | ICD-10-CM | POA: Diagnosis not present

## 2020-06-19 DIAGNOSIS — Z3A32 32 weeks gestation of pregnancy: Secondary | ICD-10-CM | POA: Diagnosis not present

## 2020-06-19 DIAGNOSIS — O24414 Gestational diabetes mellitus in pregnancy, insulin controlled: Secondary | ICD-10-CM | POA: Diagnosis not present

## 2020-06-23 DIAGNOSIS — O24414 Gestational diabetes mellitus in pregnancy, insulin controlled: Secondary | ICD-10-CM | POA: Diagnosis not present

## 2020-06-23 DIAGNOSIS — Z3A33 33 weeks gestation of pregnancy: Secondary | ICD-10-CM | POA: Diagnosis not present

## 2020-06-30 DIAGNOSIS — O24414 Gestational diabetes mellitus in pregnancy, insulin controlled: Secondary | ICD-10-CM | POA: Diagnosis not present

## 2020-06-30 DIAGNOSIS — Z3A34 34 weeks gestation of pregnancy: Secondary | ICD-10-CM | POA: Diagnosis not present

## 2020-07-03 DIAGNOSIS — M9903 Segmental and somatic dysfunction of lumbar region: Secondary | ICD-10-CM | POA: Diagnosis not present

## 2020-07-03 DIAGNOSIS — M545 Low back pain: Secondary | ICD-10-CM | POA: Diagnosis not present

## 2020-07-03 DIAGNOSIS — M9905 Segmental and somatic dysfunction of pelvic region: Secondary | ICD-10-CM | POA: Diagnosis not present

## 2020-07-03 DIAGNOSIS — M9902 Segmental and somatic dysfunction of thoracic region: Secondary | ICD-10-CM | POA: Diagnosis not present

## 2020-07-07 DIAGNOSIS — Z3685 Encounter for antenatal screening for Streptococcus B: Secondary | ICD-10-CM | POA: Diagnosis not present

## 2020-07-07 DIAGNOSIS — O24414 Gestational diabetes mellitus in pregnancy, insulin controlled: Secondary | ICD-10-CM | POA: Diagnosis not present

## 2020-07-07 DIAGNOSIS — Z3A35 35 weeks gestation of pregnancy: Secondary | ICD-10-CM | POA: Diagnosis not present

## 2020-07-07 LAB — OB RESULTS CONSOLE GBS: GBS: NEGATIVE

## 2020-07-09 DIAGNOSIS — O133 Gestational [pregnancy-induced] hypertension without significant proteinuria, third trimester: Secondary | ICD-10-CM | POA: Diagnosis not present

## 2020-07-09 DIAGNOSIS — Z3A35 35 weeks gestation of pregnancy: Secondary | ICD-10-CM | POA: Diagnosis not present

## 2020-07-10 DIAGNOSIS — Z3A35 35 weeks gestation of pregnancy: Secondary | ICD-10-CM | POA: Diagnosis not present

## 2020-07-10 DIAGNOSIS — M545 Low back pain, unspecified: Secondary | ICD-10-CM | POA: Diagnosis not present

## 2020-07-10 DIAGNOSIS — O133 Gestational [pregnancy-induced] hypertension without significant proteinuria, third trimester: Secondary | ICD-10-CM | POA: Diagnosis not present

## 2020-07-10 DIAGNOSIS — M9905 Segmental and somatic dysfunction of pelvic region: Secondary | ICD-10-CM | POA: Diagnosis not present

## 2020-07-10 DIAGNOSIS — M9902 Segmental and somatic dysfunction of thoracic region: Secondary | ICD-10-CM | POA: Diagnosis not present

## 2020-07-10 DIAGNOSIS — M9903 Segmental and somatic dysfunction of lumbar region: Secondary | ICD-10-CM | POA: Diagnosis not present

## 2020-07-14 DIAGNOSIS — O24414 Gestational diabetes mellitus in pregnancy, insulin controlled: Secondary | ICD-10-CM | POA: Diagnosis not present

## 2020-07-14 DIAGNOSIS — O133 Gestational [pregnancy-induced] hypertension without significant proteinuria, third trimester: Secondary | ICD-10-CM | POA: Diagnosis not present

## 2020-07-14 DIAGNOSIS — Z3A36 36 weeks gestation of pregnancy: Secondary | ICD-10-CM | POA: Diagnosis not present

## 2020-07-16 DIAGNOSIS — Z3A36 36 weeks gestation of pregnancy: Secondary | ICD-10-CM | POA: Diagnosis not present

## 2020-07-16 DIAGNOSIS — O24414 Gestational diabetes mellitus in pregnancy, insulin controlled: Secondary | ICD-10-CM | POA: Diagnosis not present

## 2020-07-16 DIAGNOSIS — O133 Gestational [pregnancy-induced] hypertension without significant proteinuria, third trimester: Secondary | ICD-10-CM | POA: Diagnosis not present

## 2020-07-17 ENCOUNTER — Other Ambulatory Visit: Payer: Self-pay | Admitting: Obstetrics and Gynecology

## 2020-07-17 ENCOUNTER — Encounter (HOSPITAL_COMMUNITY): Payer: Self-pay | Admitting: *Deleted

## 2020-07-17 ENCOUNTER — Telehealth (HOSPITAL_COMMUNITY): Payer: Self-pay | Admitting: *Deleted

## 2020-07-17 DIAGNOSIS — M9903 Segmental and somatic dysfunction of lumbar region: Secondary | ICD-10-CM | POA: Diagnosis not present

## 2020-07-17 DIAGNOSIS — M9905 Segmental and somatic dysfunction of pelvic region: Secondary | ICD-10-CM | POA: Diagnosis not present

## 2020-07-17 DIAGNOSIS — M545 Low back pain, unspecified: Secondary | ICD-10-CM | POA: Diagnosis not present

## 2020-07-17 DIAGNOSIS — M9902 Segmental and somatic dysfunction of thoracic region: Secondary | ICD-10-CM | POA: Diagnosis not present

## 2020-07-17 NOTE — Telephone Encounter (Signed)
Preadmission screen  

## 2020-07-18 ENCOUNTER — Inpatient Hospital Stay (HOSPITAL_COMMUNITY): Payer: BC Managed Care – PPO | Admitting: Anesthesiology

## 2020-07-18 ENCOUNTER — Other Ambulatory Visit: Payer: Self-pay

## 2020-07-18 ENCOUNTER — Inpatient Hospital Stay (HOSPITAL_COMMUNITY)
Admission: AD | Admit: 2020-07-18 | Discharge: 2020-07-20 | DRG: 807 | Disposition: A | Discharge status: Home / Self Care | Payer: BC Managed Care – PPO | Attending: Obstetrics and Gynecology | Admitting: Obstetrics and Gynecology

## 2020-07-18 ENCOUNTER — Inpatient Hospital Stay (HOSPITAL_COMMUNITY): Payer: BC Managed Care – PPO

## 2020-07-18 ENCOUNTER — Encounter (HOSPITAL_COMMUNITY): Payer: Self-pay | Admitting: Obstetrics and Gynecology

## 2020-07-18 DIAGNOSIS — O3663X0 Maternal care for excessive fetal growth, third trimester, not applicable or unspecified: Secondary | ICD-10-CM | POA: Diagnosis present

## 2020-07-18 DIAGNOSIS — O134 Gestational [pregnancy-induced] hypertension without significant proteinuria, complicating childbirth: Secondary | ICD-10-CM | POA: Diagnosis present

## 2020-07-18 DIAGNOSIS — Z0542 Observation and evaluation of newborn for suspected metabolic condition ruled out: Secondary | ICD-10-CM | POA: Diagnosis not present

## 2020-07-18 DIAGNOSIS — Z20822 Contact with and (suspected) exposure to covid-19: Secondary | ICD-10-CM | POA: Diagnosis present

## 2020-07-18 DIAGNOSIS — O24424 Gestational diabetes mellitus in childbirth, insulin controlled: Principal | ICD-10-CM | POA: Diagnosis present

## 2020-07-18 DIAGNOSIS — Z833 Family history of diabetes mellitus: Secondary | ICD-10-CM | POA: Diagnosis not present

## 2020-07-18 DIAGNOSIS — Z2882 Immunization not carried out because of caregiver refusal: Secondary | ICD-10-CM | POA: Diagnosis not present

## 2020-07-18 DIAGNOSIS — Z3A37 37 weeks gestation of pregnancy: Secondary | ICD-10-CM

## 2020-07-18 DIAGNOSIS — O139 Gestational [pregnancy-induced] hypertension without significant proteinuria, unspecified trimester: Secondary | ICD-10-CM | POA: Diagnosis present

## 2020-07-18 DIAGNOSIS — O99214 Obesity complicating childbirth: Secondary | ICD-10-CM | POA: Diagnosis not present

## 2020-07-18 DIAGNOSIS — Z3490 Encounter for supervision of normal pregnancy, unspecified, unspecified trimester: Secondary | ICD-10-CM | POA: Diagnosis present

## 2020-07-18 DIAGNOSIS — O24419 Gestational diabetes mellitus in pregnancy, unspecified control: Secondary | ICD-10-CM

## 2020-07-18 LAB — CBC
HCT: 36 % (ref 36.0–46.0)
Hemoglobin: 11.6 g/dL — ABNORMAL LOW (ref 12.0–15.0)
MCH: 27.4 pg (ref 26.0–34.0)
MCHC: 32.2 g/dL (ref 30.0–36.0)
MCV: 85.1 fL (ref 80.0–100.0)
Platelets: 174 10*3/uL (ref 150–400)
RBC: 4.23 MIL/uL (ref 3.87–5.11)
RDW: 13.6 % (ref 11.5–15.5)
WBC: 9.4 10*3/uL (ref 4.0–10.5)
nRBC: 0 % (ref 0.0–0.2)

## 2020-07-18 LAB — URIC ACID: Uric Acid, Serum: 6.2 mg/dL (ref 2.5–7.1)

## 2020-07-18 LAB — TYPE AND SCREEN
ABO/RH(D): O POS
Antibody Screen: NEGATIVE

## 2020-07-18 LAB — COMPREHENSIVE METABOLIC PANEL
ALT: 15 U/L (ref 0–44)
AST: 25 U/L (ref 15–41)
Albumin: 2.9 g/dL — ABNORMAL LOW (ref 3.5–5.0)
Alkaline Phosphatase: 97 U/L (ref 38–126)
Anion gap: 10 (ref 5–15)
BUN: 9 mg/dL (ref 6–20)
CO2: 18 mmol/L — ABNORMAL LOW (ref 22–32)
Calcium: 9.6 mg/dL (ref 8.9–10.3)
Chloride: 108 mmol/L (ref 98–111)
Creatinine, Ser: 0.58 mg/dL (ref 0.44–1.00)
GFR, Estimated: >60 mL/min (ref >60)
Glucose, Bld: 101 mg/dL — ABNORMAL HIGH (ref 70–99)
Potassium: 3.7 mmol/L (ref 3.5–5.1)
Sodium: 136 mmol/L (ref 135–145)
Total Bilirubin: 0.6 mg/dL (ref 0.3–1.2)
Total Protein: 5.8 g/dL — ABNORMAL LOW (ref 6.5–8.1)

## 2020-07-18 LAB — RESPIRATORY PANEL BY RT PCR (FLU A&B, COVID)
Influenza A by PCR: NEGATIVE
Influenza B by PCR: NEGATIVE
SARS Coronavirus 2 by RT PCR: NEGATIVE

## 2020-07-18 LAB — PROTEIN / CREATININE RATIO, URINE
Creatinine, Urine: 155.2 mg/dL
Protein Creatinine Ratio: 0.05 mg/mg{Cre} (ref 0.00–0.15)
Total Protein, Urine: 8 mg/dL

## 2020-07-18 LAB — GLUCOSE, CAPILLARY
Glucose-Capillary: 104 mg/dL — ABNORMAL HIGH (ref 70–99)
Glucose-Capillary: 95 mg/dL (ref 70–99)

## 2020-07-18 MED ORDER — PHENYLEPHRINE 40 MCG/ML (10ML) SYRINGE FOR IV PUSH (FOR BLOOD PRESSURE SUPPORT): 80.0000 ug | PREFILLED_SYRINGE | INTRAVENOUS | Status: DC | PRN

## 2020-07-18 MED ORDER — OXYTOCIN-SODIUM CHLORIDE 30-0.9 UT/500ML-% IV SOLN
1.0000 m[IU]/min | INTRAVENOUS | Status: DC
  Administered 2020-07-18: 2 m[IU]/min via INTRAVENOUS

## 2020-07-18 MED ORDER — DIPHENHYDRAMINE HCL 50 MG/ML IJ SOLN: 12.5000 mg | INTRAMUSCULAR | Status: DC | PRN

## 2020-07-18 MED ORDER — LACTATED RINGERS IV SOLN: INTRAVENOUS | Status: DC

## 2020-07-18 MED ORDER — TERBUTALINE SULFATE 1 MG/ML IJ SOLN: 0.2500 mg | Freq: Once | INTRAMUSCULAR | Status: DC | PRN

## 2020-07-18 MED ORDER — EPHEDRINE 5 MG/ML INJ: 10.0000 mg | INTRAVENOUS | Status: DC | PRN

## 2020-07-18 MED ORDER — SOD CITRATE-CITRIC ACID 500-334 MG/5ML PO SOLN: 30.0000 mL | ORAL | Status: DC | PRN

## 2020-07-18 MED ORDER — FENTANYL-BUPIVACAINE-NACL 0.5-0.125-0.9 MG/250ML-% EP SOLN
12.0000 mL/h | EPIDURAL | Status: DC | PRN
  Filled 2020-07-18: qty 250

## 2020-07-18 MED ORDER — ACETAMINOPHEN 325 MG PO TABS: 650.0000 mg | ORAL_TABLET | ORAL | Status: DC | PRN

## 2020-07-18 MED ORDER — SODIUM CHLORIDE 0.9% FLUSH: 3.0000 mL | Freq: Two times a day (BID) | INTRAVENOUS | Status: DC

## 2020-07-18 MED ORDER — OXYTOCIN-SODIUM CHLORIDE 30-0.9 UT/500ML-% IV SOLN
2.5000 [IU]/h | INTRAVENOUS | Status: DC
  Administered 2020-07-19: 2.5 [IU]/h via INTRAVENOUS
  Filled 2020-07-18: qty 500

## 2020-07-18 MED ORDER — SODIUM CHLORIDE 0.9% FLUSH: 3.0000 mL | INTRAVENOUS | Status: DC | PRN

## 2020-07-18 MED ORDER — SODIUM CHLORIDE (PF) 0.9 % IJ SOLN
INTRAMUSCULAR | Status: DC | PRN
  Administered 2020-07-18: 12 mL/h via EPIDURAL

## 2020-07-18 MED ORDER — ONDANSETRON HCL 4 MG/2ML IJ SOLN
4.0000 mg | Freq: Four times a day (QID) | INTRAMUSCULAR | Status: DC | PRN
  Administered 2020-07-18: 4 mg via INTRAVENOUS
  Filled 2020-07-18: qty 2

## 2020-07-18 MED ORDER — LACTATED RINGERS IV SOLN: 500.0000 mL | Freq: Once | INTRAVENOUS | Status: DC

## 2020-07-18 MED ORDER — LIDOCAINE HCL (PF) 1 % IJ SOLN: 30.0000 mL | INTRAMUSCULAR | Status: DC | PRN

## 2020-07-18 MED ORDER — OXYTOCIN BOLUS FROM INFUSION
333.0000 mL | Freq: Once | INTRAVENOUS | Status: AC
  Administered 2020-07-19: 333 mL via INTRAVENOUS

## 2020-07-18 MED ORDER — FAMOTIDINE 20 MG PO TABS
20.0000 mg | ORAL_TABLET | Freq: Two times a day (BID) | ORAL | Status: DC
  Administered 2020-07-18: 20 mg via ORAL
  Filled 2020-07-18: qty 1

## 2020-07-18 MED ORDER — SODIUM CHLORIDE 0.9 % IV SOLN: 250.0000 mL | INTRAVENOUS | Status: DC | PRN

## 2020-07-18 MED ORDER — LIDOCAINE HCL (PF) 1 % IJ SOLN
INTRAMUSCULAR | Status: DC | PRN
  Administered 2020-07-18: 5 mL via EPIDURAL

## 2020-07-18 NOTE — Anesthesia Preprocedure Evaluation (Signed)
Anesthesia Evaluation  Patient identified by MRN, date of birth, ID band Patient awake    Reviewed: Allergy & Precautions, NPO status , Patient's Chart, lab work & pertinent test results  Airway Mallampati: III  TM Distance: >3 FB Neck ROM: Full    Dental no notable dental hx. (+) Teeth Intact   Pulmonary neg pulmonary ROS,    Pulmonary exam normal breath sounds clear to auscultation       Cardiovascular hypertension, Normal cardiovascular exam Rhythm:Regular Rate:Normal     Neuro/Psych negative neurological ROS  negative psych ROS   GI/Hepatic negative GI ROS, Neg liver ROS,   Endo/Other  diabetes, Gestational, Insulin Dependent  Renal/GU      Musculoskeletal   Abdominal (+) + obese,   Peds  Hematology Hgb 11.6 plt 174   Anesthesia Other Findings   Reproductive/Obstetrics (+) Pregnancy                             Anesthesia Physical Anesthesia Plan  ASA: III  Anesthesia Plan: Epidural   Post-op Pain Management:    Induction:   PONV Risk Score and Plan:   Airway Management Planned:   Additional Equipment:   Intra-op Plan:   Post-operative Plan:   Informed Consent: I have reviewed the patients History and Physical, chart, labs and discussed the procedure including the risks, benefits and alternatives for the proposed anesthesia with the patient or authorized representative who has indicated his/her understanding and acceptance.       Plan Discussed with:   Anesthesia Plan Comments: (37 wk G3p2 W gHtn ,gDM for LEA)       Anesthesia Quick Evaluation

## 2020-07-18 NOTE — Progress Notes (Signed)
Patricia Bailey is a 34 y.o. G3P2002 at [redacted]w[redacted]d by ultrasound admitted for IOL for A2GDM and GHTN   Subjective: S/p epidural. Feeling more comfortable, but nauseated and c/o heartburn.   Objective: Vitals:   07/18/20 2205 07/18/20 2210 07/18/20 2215 07/18/20 2220  BP: 140/85 138/78 127/83 131/82  Pulse: 73 80 72 72  Resp: 18 18 18 18   Temp:      TempSrc:      Weight:      Height:        No intake/output data recorded. No intake/output data recorded.   FHT:  FHR: 135 bpm bpm, variability: moderate,  accelerations:  Present,  decelerations:  Absent UC:   regular, every 2-4.5 minutes SVE:   Dilation: 4.5 Effacement (%): 60 Station: -2, -3 Exam by:: M Daena Alper, CNM   Labs:   Results for orders placed or performed during the hospital encounter of 07/18/20 (from the past 24 hour(s))  CBC     Status: Abnormal   Collection Time: 07/18/20  7:16 PM  Result Value Ref Range   WBC 9.4 4.0 - 10.5 K/uL   RBC 4.23 3.87 - 5.11 MIL/uL   Hemoglobin 11.6 (L) 12.0 - 15.0 g/dL   HCT 07/20/20 36 - 46 %   MCV 85.1 80.0 - 100.0 fL   MCH 27.4 26.0 - 34.0 pg   MCHC 32.2 30.0 - 36.0 g/dL   RDW 67.1 24.5 - 80.9 %   Platelets 174 150 - 400 K/uL   nRBC 0.0 0.0 - 0.2 %  Comprehensive metabolic panel     Status: Abnormal   Collection Time: 07/18/20  7:16 PM  Result Value Ref Range   Sodium 136 135 - 145 mmol/L   Potassium 3.7 3.5 - 5.1 mmol/L   Chloride 108 98 - 111 mmol/L   CO2 18 (L) 22 - 32 mmol/L   Glucose, Bld 101 (H) 70 - 99 mg/dL   BUN 9 6 - 20 mg/dL   Creatinine, Ser 07/20/20 0.44 - 1.00 mg/dL   Calcium 9.6 8.9 - 3.82 mg/dL   Total Protein 5.8 (L) 6.5 - 8.1 g/dL   Albumin 2.9 (L) 3.5 - 5.0 g/dL   AST 25 15 - 41 U/L   ALT 15 0 - 44 U/L   Alkaline Phosphatase 97 38 - 126 U/L   Total Bilirubin 0.6 0.3 - 1.2 mg/dL   GFR, Estimated 50.5 >39 mL/min   Anion gap 10 5 - 15  Uric acid     Status: None   Collection Time: 07/18/20  7:16 PM  Result Value Ref Range   Uric Acid, Serum 6.2 2.5 - 7.1  mg/dL  Type and screen     Status: None   Collection Time: 07/18/20  7:20 PM  Result Value Ref Range   ABO/RH(D) O POS    Antibody Screen NEG    Sample Expiration      07/21/2020,2359 Performed at [facility], [address] [place], [place]   Glucose, capillary     Status: Abnormal   Collection Time: 07/18/20  7:37 PM  Result Value Ref Range   Glucose-Capillary 104 (H) 70 - 99 mg/dL  Respiratory Panel by RT PCR (Flu A&B, Covid) - Nasopharyngeal Swab     Status: None   Collection Time: 07/18/20  8:22 PM   Specimen: Nasopharyngeal Swab  Result Value Ref Range   SARS Coronavirus 2 by RT PCR NEGATIVE NEGATIVE   Influenza A by PCR NEGATIVE NEGATIVE  Influenza B by PCR NEGATIVE NEGATIVE  Protein / creatinine ratio, urine     Status: None   Collection Time: 07/18/20  9:52 PM  Result Value Ref Range   Creatinine, Urine 155.20 mg/dL   Total Protein, Urine 8 mg/dL   Protein Creatinine Ratio 0.05 0.00 - 0.15 mg/mg[Cre]  Glucose, capillary     Status: None   Collection Time: 07/18/20 10:06 PM  Result Value Ref Range   Glucose-Capillary 95 70 - 99 mg/dL   Assessment / Plan: [id number] 34 y.o. [redacted]w[redacted]d Induction of labor due to gestational hypertension and gestational diabetes,  progressing well on pitocin  LGA  Labor: Progressing on Pitocin, will continue to increase then AROM Preeclampsia:  no signs or symptoms of toxicity and labs stable  GDM: well controlled CBGs at this time; insulin drip not indicated  Fetal Wellbeing:  Category I Pain Control:  Epidural I/D:  n/a Anticipated MOD:  NSVD  Karena Addison, CNM, MSN 07/18/2020, 10:50 PM

## 2020-07-18 NOTE — H&P (Signed)
OB ADMISSION/ HISTORY & PHYSICAL:  Admission Date: 07/18/2020  6:48 PM  Admit Diagnosis: Encounter for induction of labor [Z34.90]    Patricia Bailey is a 34 y.o. female G3P2 at 27 weeks presenting for induction of labor for A2GDM on lantus and GHTN, not currently on meds.   Prenatal History: [id number]   EDC : 08/08/2020, by Other Basis  Prenatal care at [facility] since 10 weeks   Prenatal course complicated by: - Gestational Hypertension, not on meds - Early onset A2GDM on Lantus 8 units QHS; glucose intolerance vs. Pre-existing DM - Obesity BMI 42 - Hx. Of PEC with 1st pregnancy 14 years ago - Hx. Of GDM with last pregnancy - UTI in pregnancy  - LGA  Prenatal Labs: ABO, Rh:   O Positive  Antibody: PENDING (10/15 1920) Rubella:    RPR:    HBsAg:    HIV:    GBS:   Negative Genetic Screening: [employer] NIPS normal XY Ultrasound: CUS: normal XY anatomy, limited views of right outflow tract, 4CH seen Serial growth ultrasound and fetal testing: last growth on 10/11 EFW 7#9 @ 93%, AC 96%, BPD 98%, AFI WNL, BPP 8/8 S/p COVID vaccine Tdap UTD     Maternal Diabetes: Yes:  Diabetes Type:  Insulin/Medication controlled Genetic Screening: Normal Maternal Ultrasounds/Referrals: Normal Fetal Ultrasounds or other Referrals:  None Maternal Substance Abuse:  No Significant Maternal Medications:  Meds include: Other: BBASA 81mg , Lantus 8 units QHS, Pepcid 20mg  BID Significant Maternal Lab Results:  Group B Strep negative Other Comments:  None  Medical / Surgical History :  Past medical history:  Past Medical History:  Diagnosis Date  . Diabetes mellitus   . Gestational diabetes    glyburide  . History of chicken pox   . Obesity   . Pregnancy induced hypertension      Past surgical history:  Past Surgical History:  Procedure Laterality Date  . NO PAST SURGERIES       Family History: No family history on file.   Social History:  reports that she has never smoked.  She has never used smokeless tobacco. She reports that she does not drink alcohol and does not use drugs.   Allergies: Stadol [butorphanol tartrate]   Current Medications at time of admission:  Medications Prior to Admission  Medication Sig Dispense Refill Last Dose  . Ascorbic Acid (VITAMIN C) 1000 MG tablet Take 1,000 mg by mouth daily.   07/17/2020 at Unknown time  . aspirin EC 81 MG tablet Take 81 mg by mouth daily. Swallow whole.   07/17/2020 at Unknown time  . famotidine (PEPCID) 20 MG tablet Take 20 mg by mouth 2 (two) times daily.   07/18/2020 at Unknown time  . ferrous sulfate 325 (65 FE) MG tablet Take 325 mg by mouth daily with breakfast.   07/17/2020 at Unknown time  . magnesium 30 MG tablet Take 30 mg by mouth 2 (two) times daily.   07/17/2020 at Unknown time  . Prenatal Vit-Fe Fumarate-FA (MULTIVITAMIN-PRENATAL) 27-0.8 MG TABS Take 1 tablet by mouth daily.   07/17/2020 at Unknown time  . Pyridoxine HCl (VITAMIN B-6) 500 MG tablet Take 500 mg by mouth daily.   07/17/2020 at Unknown time     Review of Systems: ROS Active fetal movement Denies HA, visual changes, RUQ/epigastric pain  Irregular ctxs Denies LOF, reports some bloody show earlier today  Physical Exam: Vital signs and nursing notes reviewed.  ED Triage Vitals  Enc Vitals Group  BP 07/18/20 1941 (!) 134/94     Pulse Rate 07/18/20 1941 73     Resp 07/18/20 1859 16     Temp 07/18/20 1859 98.2 F (36.8 C)     Temp Source 07/18/20 1859 Oral     SpO2 --      Weight 07/18/20 1856 261 lb 4.8 oz (118.5 kg)     Height 07/18/20 1856 5\' 7"  (1.702 m)     Head Circumference --      Peak Flow --      Pain Score 07/18/20 1937 0     Pain Loc --      Pain Edu? --      Excl. in GC? --      General: AAO x 3, NAD, pleasant  Heart: RRR Lungs:CTAB Abdomen: Gravid, NT, Leopold's 7#8oz Extremities: trace LE edema, no clonus, +2 DTRs bilaterally Genitalia / VE: Dilation: 4 Effacement (%): 60 Station:  -3 Presentation: Vertex Exam by:: Merdith Darsh Vandevoort CNM   FHR: 135 BPM, moderate variability, +15x15 accels, no decels TOCO: occasional   Labs:   Pending T&S, CBC, RPR  Recent Labs    07/18/20 1916  WBC 9.4  HGB 11.6*  HCT 36.0  PLT 174       Assessment:  34 y.o. G3P2002 at [redacted]w[redacted]d  1. Induction stage of labor for A2GDM and GTN 2. FHR category 1 3. GBS Negative 4. Desires epidural 5. Breastfeeding/Bottle 6. Placenta disposal hospital disposal per patient request  Plan:  1. Admit to BS   - PIH labs   - CBGs q2 hours  2. Routine L&D orders 3. Epidural upon request 4. Expectant management 5. Anticipate NSVB  - Proven pelvis: 7#12  Dr. [redacted]w[redacted]d notified of admission / plan of care   Billy Coast CNM, MSN 07/18/2020, 7:54 PM

## 2020-07-18 NOTE — Anesthesia Procedure Notes (Signed)
Epidural Patient location during procedure: OB Start time: 07/18/2020 9:28 PM End time: 07/18/2020 9:47 PM  Staffing Anesthesiologist: Trevor Iha, MD Performed: anesthesiologist   Preanesthetic Checklist Completed: patient identified, IV checked, site marked, risks and benefits discussed, surgical consent, monitors and equipment checked, pre-op evaluation and timeout performed  Epidural Patient position: sitting Prep: DuraPrep and site prepped and draped Patient monitoring: continuous pulse ox and blood pressure Approach: midline Location: L3-L4 Injection technique: LOR air  Needle:  Needle type: Tuohy  Needle gauge: 17 G Needle length: 9 cm and 9 Needle insertion depth: 9 cm Catheter type: closed end flexible Catheter size: 19 Gauge Catheter at skin depth: 15 cm Test dose: negative  Assessment Events: blood not aspirated, injection not painful, no injection resistance, no paresthesia and negative IV test  Additional Notes Patient identified. Risks/Benefits/Options discussed with patient including but not limited to bleeding, infection, nerve damage, paralysis, failed block, incomplete pain control, headache, blood pressure changes, nausea, vomiting, reactions to medication both or allergic, itching and postpartum back pain. Confirmed with bedside nurse the patient's most recent platelet count. Confirmed with patient that they are not currently taking any anticoagulation, have any bleeding history or any family history of bleeding disorders. Patient expressed understanding and wished to proceed. All questions were answered. Sterile technique was used throughout the entire procedure. Please see nursing notes for vital signs. Test dose was given through epidural needle and negative prior to continuing to dose epidural or start infusion. Warning signs of high block given to the patient including shortness of breath, tingling/numbness in hands, complete motor block, or any  concerning symptoms with instructions to call for help. Patient was given instructions on fall risk and not to get out of bed. All questions and concerns addressed with instructions to call with any issues. 2 Attempt (S) . Patient tolerated procedure well.

## 2020-07-19 ENCOUNTER — Encounter (HOSPITAL_COMMUNITY): Payer: Self-pay | Admitting: Obstetrics and Gynecology

## 2020-07-19 LAB — CBC
HCT: 34.7 % — ABNORMAL LOW (ref 36.0–46.0)
Hemoglobin: 11.3 g/dL — ABNORMAL LOW (ref 12.0–15.0)
MCH: 27.6 pg (ref 26.0–34.0)
MCHC: 32.6 g/dL (ref 30.0–36.0)
MCV: 84.6 fL (ref 80.0–100.0)
Platelets: 181 10*3/uL (ref 150–400)
RBC: 4.1 MIL/uL (ref 3.87–5.11)
RDW: 13.3 % (ref 11.5–15.5)
WBC: 14 10*3/uL — ABNORMAL HIGH (ref 4.0–10.5)
nRBC: 0 % (ref 0.0–0.2)

## 2020-07-19 LAB — GLUCOSE, CAPILLARY
Glucose-Capillary: 95 mg/dL (ref 70–99)
Glucose-Capillary: 104 mg/dL — ABNORMAL HIGH (ref 70–99)

## 2020-07-19 LAB — RPR: RPR Ser Ql: NONREACTIVE

## 2020-07-19 MED ORDER — SIMETHICONE 80 MG PO CHEW: 80.0000 mg | CHEWABLE_TABLET | ORAL | Status: DC | PRN

## 2020-07-19 MED ORDER — ONDANSETRON HCL 4 MG PO TABS: 4.0000 mg | ORAL_TABLET | ORAL | Status: DC | PRN

## 2020-07-19 MED ORDER — COCONUT OIL OIL: 1.0000 "application " | TOPICAL_OIL | Status: DC | PRN

## 2020-07-19 MED ORDER — DIBUCAINE (PERIANAL) 1 % EX OINT: 1.0000 "application " | TOPICAL_OINTMENT | CUTANEOUS | Status: DC | PRN

## 2020-07-19 MED ORDER — SENNOSIDES-DOCUSATE SODIUM 8.6-50 MG PO TABS
2.0000 | ORAL_TABLET | ORAL | Status: DC
  Administered 2020-07-19: 2 via ORAL
  Filled 2020-07-19: qty 2

## 2020-07-19 MED ORDER — IBUPROFEN 600 MG PO TABS
600.0000 mg | ORAL_TABLET | Freq: Four times a day (QID) | ORAL | Status: DC
  Administered 2020-07-19 – 2020-07-20 (×5): 600 mg via ORAL
  Filled 2020-07-19 (×5): qty 1

## 2020-07-19 MED ORDER — ONDANSETRON HCL 4 MG/2ML IJ SOLN: 4.0000 mg | INTRAMUSCULAR | Status: DC | PRN

## 2020-07-19 MED ORDER — ZOLPIDEM TARTRATE 5 MG PO TABS: 5.0000 mg | ORAL_TABLET | Freq: Every evening | ORAL | Status: DC | PRN

## 2020-07-19 MED ORDER — PRENATAL MULTIVITAMIN CH
1.0000 | ORAL_TABLET | Freq: Every day | ORAL | Status: DC
  Administered 2020-07-19 – 2020-07-20 (×2): 1 via ORAL
  Filled 2020-07-19 (×2): qty 1

## 2020-07-19 MED ORDER — FAMOTIDINE 20 MG PO TABS
20.0000 mg | ORAL_TABLET | Freq: Two times a day (BID) | ORAL | Status: DC
  Administered 2020-07-19: 20 mg via ORAL
  Filled 2020-07-19: qty 1

## 2020-07-19 MED ORDER — DIPHENHYDRAMINE HCL 25 MG PO CAPS: 25.0000 mg | ORAL_CAPSULE | Freq: Four times a day (QID) | ORAL | Status: DC | PRN

## 2020-07-19 MED ORDER — BENZOCAINE-MENTHOL 20-0.5 % EX AERO
1.0000 "application " | INHALATION_SPRAY | CUTANEOUS | Status: DC | PRN
  Administered 2020-07-20: 1 via TOPICAL
  Filled 2020-07-19: qty 56

## 2020-07-19 MED ORDER — WITCH HAZEL-GLYCERIN EX PADS: 1.0000 "application " | MEDICATED_PAD | CUTANEOUS | Status: DC | PRN

## 2020-07-19 MED ORDER — ACETAMINOPHEN 325 MG PO TABS
650.0000 mg | ORAL_TABLET | ORAL | Status: DC | PRN
  Administered 2020-07-19 – 2020-07-20 (×6): 650 mg via ORAL
  Filled 2020-07-19 (×6): qty 2

## 2020-07-19 NOTE — Progress Notes (Signed)
Patricia Bailey is a 34 y.o. G3P2002 at [redacted]w[redacted]d by ultrasound admitted for IOL for A2GDM and GHTN   Subjective: Comfortable with epidural. Has been resting on and off using peanut ball.   Objective: Vitals:   07/19/20 0100 07/19/20 0130 07/19/20 0200 07/19/20 0230  BP: (!) 105/47 (!) 90/44 (!) 98/58 136/70  Pulse: (!) 57 (!) 59 64 79  Resp: 18 18 18 18   Temp:      TempSrc:      Weight:      Height:        No intake/output data recorded. No intake/output data recorded.   FHT:  FHR: 135 bpm bpm, variability: moderate,  accelerations:  Present,  decelerations: occasional variable  UC:   Difficult to palpate and trace with toco SVE:   Dilation: 5 Effacement (%): 70 Station: -2 Exam by:: M Signmon, CNM   AROM at 2:23am for copious clear fluid IUPC placed without difficulty   Labs:   Recent Labs    07/18/20 1916  WBC 9.4  HGB 11.6*  HCT 36.0  PLT 174    Assessment / Plan: 07/20/20 34 y.o. [redacted]w[redacted]d Induction of labor due to gestational hypertension and gestational diabetes,  progressing well on pitocin  LGA  Labor: Progressing on Pitocin, continue to titrate to achieve MVUs>200 Preeclampsia:  no signs or symptoms of toxicity and labs stable  GDM: well controlled CBGs at this time; insulin drip not indicated  Fetal Wellbeing:  Category II with progression to 1 Pain Control:  Epidural I/D:  n/a Anticipated MOD:  NSVD  [redacted]w[redacted]d, CNM, MSN 07/19/2020, 3:00 AM

## 2020-07-19 NOTE — Anesthesia Postprocedure Evaluation (Signed)
Anesthesia Post Note  Patient: Patricia Bailey  Procedure(s) Performed: AN AD HOC LABOR EPIDURAL     Patient location during evaluation: Mother Baby Anesthesia Type: Epidural Level of consciousness: awake and alert, oriented and patient cooperative Pain management: pain level controlled Vital Signs Assessment: post-procedure vital signs reviewed and stable Respiratory status: spontaneous breathing Cardiovascular status: stable Postop Assessment: no headache, epidural receding, patient able to bend at knees and no signs of nausea or vomiting Anesthetic complications: no Comments: Pt. States she is walking.  No c/o pain.   No complications documented.  Last Vitals:  Vitals:   07/19/20 1224 07/19/20 1410  BP: 115/65 130/72  Pulse: 73 60  Resp: 16 17  Temp:  37.2 C  SpO2:  100%    Last Pain:  Vitals:   07/19/20 1410  TempSrc: Oral  PainSc:    Pain Goal:                   [facility]

## 2020-07-20 LAB — CBC
HCT: 31 % — ABNORMAL LOW (ref 36.0–46.0)
Hemoglobin: 10.3 g/dL — ABNORMAL LOW (ref 12.0–15.0)
MCH: 28.4 pg (ref 26.0–34.0)
MCHC: 33.2 g/dL (ref 30.0–36.0)
MCV: 85.4 fL (ref 80.0–100.0)
Platelets: 163 10*3/uL (ref 150–400)
RBC: 3.63 MIL/uL — ABNORMAL LOW (ref 3.87–5.11)
RDW: 13.5 % (ref 11.5–15.5)
WBC: 6.8 10*3/uL (ref 4.0–10.5)
nRBC: 0 % (ref 0.0–0.2)

## 2020-07-20 LAB — GLUCOSE, CAPILLARY: Glucose-Capillary: 85 mg/dL (ref 70–99)

## 2020-07-20 NOTE — Discharge Summary (Signed)
OB Discharge Summary  Patient Name: Patricia Bailey DOB: [birth year] MRN: [id number]  Date of admission: 07/18/2020 Delivering provider: Carlean Jews C   Admitting diagnosis: Encounter for induction of labor [Z34.90] Intrauterine pregnancy: [redacted]w[redacted]d     Secondary diagnosis: Patient Active Problem List   Diagnosis Date Noted  . SVD (spontaneous vaginal delivery) 07/19/2020  . Postpartum care following vaginal delivery (10/16) 07/19/2020  . Encounter for induction of labor 07/18/2020  . Gestational hypertension 07/18/2020  . GDM, class A2 07/18/2020   Additional problems:na   Date of discharge: 07/20/2020   Discharge diagnosis: Principal Problem:   Postpartum care following vaginal delivery (10/16) Active Problems:   Encounter for induction of labor   Gestational hypertension   GDM, class A2   SVD (spontaneous vaginal delivery)                                                              Post partum procedures:na  Augmentation: AROM Pain control: Epidural  Laceration:None  Episiotomy:None  Complications: None  Hospital course:  Onset of Labor With Vaginal Delivery      34 y.o. yo G3P3003 at [redacted]w[redacted]d was admitted in Active Labor on 07/18/2020. Patient had an uncomplicated labor course as follows:  Membrane Rupture Time/Date: 2:23 AM ,07/19/2020   Delivery Method:Vaginal, Spontaneous  Episiotomy: None  Lacerations:  None  Patient had an uncomplicated postpartum course.  She is ambulating, tolerating a regular diet, passing flatus, and urinating well. Patient is discharged home in stable condition on 07/20/20.  Newborn Data: Birth date:07/19/2020  Birth time:5:37 AM  Gender:Female  Living status:Living  Apgars:9 ,9  Weight:3345 g   Physical exam  Vitals:   07/19/20 1410 07/19/20 1754 07/19/20 2017 07/20/20 0647  BP: 130/72 118/79 116/75 128/65  Pulse: 60 70 70 68  Resp: 17 16 16 16   Temp: 98.9 F (37.2 C) 98.5 F (36.9 C) 99 F (37.2 C) 98.6 F (37 C)  TempSrc:  Oral Oral Oral Oral  SpO2: 100% 98% 98% 98%  Weight:      Height:       General: alert, cooperative and no distress Lochia: appropriate Uterine Fundus: firm Incision: Healing well with no significant drainage Perineum: repair intact, min edema DVT Evaluation: No evidence of DVT seen on physical exam. Labs: Lab Results  Component Value Date   WBC 6.8 07/20/2020   HGB 10.3 (L) 07/20/2020   HCT 31.0 (L) 07/20/2020   MCV 85.4 07/20/2020   PLT 163 07/20/2020   CMP Latest Ref Rng & Units 07/18/2020  Glucose 70 - 99 mg/dL 07/20/2020)  BUN 6 - 20 mg/dL 9  Creatinine 737(T - 0.62 mg/dL 6.94  Sodium 8.54 - 627 mmol/L 136  Potassium 3.5 - 5.1 mmol/L 3.7  Chloride 98 - 111 mmol/L 108  CO2 22 - 32 mmol/L 18(L)  Calcium 8.9 - 10.3 mg/dL 9.6  Total Protein 6.5 - 8.1 g/dL 035)  Total Bilirubin 0.3 - 1.2 mg/dL 0.6  Alkaline Phos 38 - 126 U/L 97  AST 15 - 41 U/L 25  ALT 0 - 44 U/L 15   Edinburgh Postnatal Depression Scale Screening Tool 07/19/2020  I have been able to laugh and see the funny side of things. 0  I have looked forward with enjoyment to things. 0  I have  blamed myself unnecessarily when things went wrong. 0  I have been anxious or worried for no good reason. 1  I have felt scared or panicky for no good reason. 1  Things have been getting on top of me. 0  I have been so unhappy that I have had difficulty sleeping. 0  I have felt sad or miserable. 0  I have been so unhappy that I have been crying. 0  The thought of harming myself has occurred to me. 0  Edinburgh Postnatal Depression Scale Total 2   Vaccines: TDaP          na         Flu             na                    COVID-19 na  Discharge instruction:  per After Visit Summary,  [facility] booklet and  "Understanding Mother & Baby Care" hospital booklet  After Visit Meds:  Allergies as of 07/20/2020      Reactions   Stadol [butorphanol Tartrate]    Patient states that stadol makes her itchy and delusional       Medication List    STOP taking these medications   aspirin EC 81 MG tablet     TAKE these medications   famotidine 20 MG tablet Commonly known as: PEPCID Take 20 mg by mouth 2 (two) times daily.   ferrous sulfate 325 (65 FE) MG tablet Take 325 mg by mouth daily with breakfast.   Lantus SoloStar 100 UNIT/ML Solostar Pen Generic drug: insulin glargine Inject 8 Units into the skin at bedtime.   magnesium 30 MG tablet Take 30 mg by mouth daily.   multivitamin-prenatal 27-0.8 MG Tabs tablet Take 1 tablet by mouth daily.   PROBIOTIC-10 PO Take 1 tablet by mouth daily.   UNISOM SLEEPGELS PO Take 1 tablet by mouth at bedtime as needed (sleep).   vitamin B-6 500 MG tablet Take 500 mg by mouth daily.   vitamin C 1000 MG tablet Take 1,000 mg by mouth daily.       Diet: routine diet  Activity: Advance as tolerated. Pelvic rest for 6 weeks.   Postpartum contraception: Not Discussed  Newborn Data: Live born female  Birth Weight: 7 lb 6 oz (3345 g) APGAR: 9, 9  Newborn Delivery   Birth date/time: 07/19/2020 05:37:00 Delivery type: Vaginal, Spontaneous      named na Baby Feeding: Breast Disposition:home with mother   Delivery Report:  Review the Delivery Report for details.    Follow up:     Signed: Milbert Coulter, MSN 07/20/2020, 11:55 AM

## 2020-07-20 NOTE — Progress Notes (Signed)
Post Partum Day 1 Subjective: no complaints, up ad lib, voiding, tolerating PO and + flatus  Objective: Blood pressure 128/65, pulse 68, temperature 98.6 F (37 C), temperature source Oral, resp. rate 16, height 5\' 7"  (1.702 m), weight 118.5 kg, SpO2 98 %, unknown if currently breastfeeding.  Physical Exam:  General: alert, cooperative and appears stated age Lochia: appropriate Uterine Fundus: firm Incision: healing well, no significant drainage DVT Evaluation: No evidence of DVT seen on physical exam. Negative Homan's sign.  Recent Labs    07/19/20 0726 07/20/20 0429  HGB 11.3* 10.3*  HCT 34.7* 31.0*    Assessment/Plan: Stable PPD 1 Discharge home   LOS: 2 days   Zymier Rodgers J 07/20/2020, 8:51 AM

## 2020-07-21 ENCOUNTER — Ambulatory Visit: Payer: Self-pay

## 2020-07-21 NOTE — Lactation Note (Signed)
This note was copied from a baby's chart. Lactation Consultation Note  Patient Name: Patricia Bailey [id number] Date: 07/21/2020 Reason for consult: Follow-up assessment;Early term 37-38.6wks;Hyperbilirubinemia  P3 mother whose infant is now 81 hours old.  This is an ETI at 37+1 weeks with a 9% weight loss.  Baby has been under phototherapy and is currently still under one biliblanket.  Bilirubin level was drawn this morning; no results yet.  Mother has a history of jaundice with her other chidren (now 51 and 52 years old).  One child ended up receiving a exchange transfusion due to increased bilirubin levels.  Mother feels like baby has been latching but now is getting more sleepy.  Discussed this behavior with mother and she is willing to begin supplementation.  Offered donor milk/formula and mother desires formula since that is what she plans to use at home.  Reviewed supplementation guidelines and basic principles regarding the ETI.  Demonstrated paced bottle feeding and father feeding baby without difficulty using the extra slow flow nipple.  Initiated the DEBP with mother.  Pump parts, assembly, disassembly and cleaning reviewed.  Mother demonstrated pump assembly.  Placed her in #27 flanges for better fit and comfort.  Mother may call me at the next feeding to observe a latch.  She is aware to continue feeding at least every three hours or with cues and to supplement after every breast feeding session.  She will provide any EBM she obtains first prior to using formula.  Mother will call for any questions/concerns.  RN updated.  Mother has a DEBP for home use.  Family desires a discharge today if possible.   Maternal Data Formula Feeding for Exclusion: No Has patient been taught Hand Expression?: Yes Does the patient have breastfeeding experience prior to this delivery?: Yes  Feeding    LATCH Score                   Interventions    Lactation Tools Discussed/Used Pump  Review: Setup, frequency, and cleaning;Milk Storage Initiated by:: Grasiela Jonsson   Consult Status Consult Status: Follow-up Date: 07/22/20 Follow-up type: In-patient    Johanna Matto R Mana Morison 07/21/2020, 8:24 AM

## 2020-07-25 DIAGNOSIS — O134 Gestational [pregnancy-induced] hypertension without significant proteinuria, complicating childbirth: Secondary | ICD-10-CM | POA: Diagnosis not present

## 2020-08-01 ENCOUNTER — Encounter (HOSPITAL_COMMUNITY): Payer: Commercial Managed Care - PPO

## 2020-08-07 DIAGNOSIS — M9902 Segmental and somatic dysfunction of thoracic region: Secondary | ICD-10-CM | POA: Diagnosis not present

## 2020-08-07 DIAGNOSIS — M9903 Segmental and somatic dysfunction of lumbar region: Secondary | ICD-10-CM | POA: Diagnosis not present

## 2020-08-07 DIAGNOSIS — M545 Low back pain, unspecified: Secondary | ICD-10-CM | POA: Diagnosis not present

## 2020-08-07 DIAGNOSIS — M9905 Segmental and somatic dysfunction of pelvic region: Secondary | ICD-10-CM | POA: Diagnosis not present

## 2020-09-04 DIAGNOSIS — Z8632 Personal history of gestational diabetes: Secondary | ICD-10-CM | POA: Diagnosis not present

## 2020-09-18 DIAGNOSIS — F4323 Adjustment disorder with mixed anxiety and depressed mood: Secondary | ICD-10-CM | POA: Diagnosis not present

## 2020-10-04 HISTORY — PX: OTHER SURGICAL HISTORY: SHX169

## 2020-10-22 DIAGNOSIS — U071 COVID-19: Secondary | ICD-10-CM | POA: Diagnosis not present

## 2020-11-13 DIAGNOSIS — M5451 Vertebrogenic low back pain: Secondary | ICD-10-CM | POA: Diagnosis not present

## 2020-11-13 DIAGNOSIS — M62830 Muscle spasm of back: Secondary | ICD-10-CM | POA: Diagnosis not present

## 2020-11-13 DIAGNOSIS — M9903 Segmental and somatic dysfunction of lumbar region: Secondary | ICD-10-CM | POA: Diagnosis not present

## 2020-11-13 DIAGNOSIS — M9905 Segmental and somatic dysfunction of pelvic region: Secondary | ICD-10-CM | POA: Diagnosis not present

## 2020-11-20 DIAGNOSIS — M62830 Muscle spasm of back: Secondary | ICD-10-CM | POA: Diagnosis not present

## 2020-11-20 DIAGNOSIS — M9903 Segmental and somatic dysfunction of lumbar region: Secondary | ICD-10-CM | POA: Diagnosis not present

## 2020-11-20 DIAGNOSIS — M5451 Vertebrogenic low back pain: Secondary | ICD-10-CM | POA: Diagnosis not present

## 2020-11-20 DIAGNOSIS — M9905 Segmental and somatic dysfunction of pelvic region: Secondary | ICD-10-CM | POA: Diagnosis not present

## 2020-11-21 DIAGNOSIS — M5416 Radiculopathy, lumbar region: Secondary | ICD-10-CM | POA: Diagnosis not present

## 2020-11-27 DIAGNOSIS — M9903 Segmental and somatic dysfunction of lumbar region: Secondary | ICD-10-CM | POA: Diagnosis not present

## 2020-11-27 DIAGNOSIS — M5451 Vertebrogenic low back pain: Secondary | ICD-10-CM | POA: Diagnosis not present

## 2020-11-27 DIAGNOSIS — M9905 Segmental and somatic dysfunction of pelvic region: Secondary | ICD-10-CM | POA: Diagnosis not present

## 2020-11-27 DIAGNOSIS — M62830 Muscle spasm of back: Secondary | ICD-10-CM | POA: Diagnosis not present

## 2021-01-06 DIAGNOSIS — F4323 Adjustment disorder with mixed anxiety and depressed mood: Secondary | ICD-10-CM | POA: Diagnosis not present

## 2021-01-13 DIAGNOSIS — F411 Generalized anxiety disorder: Secondary | ICD-10-CM | POA: Diagnosis not present

## 2021-01-20 DIAGNOSIS — F411 Generalized anxiety disorder: Secondary | ICD-10-CM | POA: Diagnosis not present

## 2021-01-26 DIAGNOSIS — F411 Generalized anxiety disorder: Secondary | ICD-10-CM | POA: Diagnosis not present

## 2021-01-28 DIAGNOSIS — F411 Generalized anxiety disorder: Secondary | ICD-10-CM | POA: Diagnosis not present

## 2021-02-02 DIAGNOSIS — F4323 Adjustment disorder with mixed anxiety and depressed mood: Secondary | ICD-10-CM | POA: Diagnosis not present

## 2021-02-05 DIAGNOSIS — F411 Generalized anxiety disorder: Secondary | ICD-10-CM | POA: Diagnosis not present

## 2021-02-11 DIAGNOSIS — F411 Generalized anxiety disorder: Secondary | ICD-10-CM | POA: Diagnosis not present

## 2021-03-04 DIAGNOSIS — F411 Generalized anxiety disorder: Secondary | ICD-10-CM | POA: Diagnosis not present

## 2021-03-11 DIAGNOSIS — F411 Generalized anxiety disorder: Secondary | ICD-10-CM | POA: Diagnosis not present

## 2021-03-18 DIAGNOSIS — F411 Generalized anxiety disorder: Secondary | ICD-10-CM | POA: Diagnosis not present

## 2021-04-02 DIAGNOSIS — Z6841 Body Mass Index (BMI) 40.0 and over, adult: Secondary | ICD-10-CM | POA: Diagnosis not present

## 2021-04-08 DIAGNOSIS — F4325 Adjustment disorder with mixed disturbance of emotions and conduct: Secondary | ICD-10-CM | POA: Diagnosis not present

## 2021-04-13 DIAGNOSIS — Z13220 Encounter for screening for lipoid disorders: Secondary | ICD-10-CM | POA: Diagnosis not present

## 2021-04-13 DIAGNOSIS — Z0000 Encounter for general adult medical examination without abnormal findings: Secondary | ICD-10-CM | POA: Diagnosis not present

## 2021-04-13 DIAGNOSIS — Z1331 Encounter for screening for depression: Secondary | ICD-10-CM | POA: Diagnosis not present

## 2021-04-13 DIAGNOSIS — Z6841 Body Mass Index (BMI) 40.0 and over, adult: Secondary | ICD-10-CM | POA: Diagnosis not present

## 2021-04-15 DIAGNOSIS — Z713 Dietary counseling and surveillance: Secondary | ICD-10-CM | POA: Diagnosis not present

## 2021-04-15 DIAGNOSIS — Z6841 Body Mass Index (BMI) 40.0 and over, adult: Secondary | ICD-10-CM | POA: Diagnosis not present

## 2021-04-21 DIAGNOSIS — Z01818 Encounter for other preprocedural examination: Secondary | ICD-10-CM | POA: Diagnosis not present

## 2021-04-21 DIAGNOSIS — Z6841 Body Mass Index (BMI) 40.0 and over, adult: Secondary | ICD-10-CM | POA: Diagnosis not present

## 2021-04-22 DIAGNOSIS — F411 Generalized anxiety disorder: Secondary | ICD-10-CM | POA: Diagnosis not present

## 2021-04-23 DIAGNOSIS — Z9884 Bariatric surgery status: Secondary | ICD-10-CM | POA: Diagnosis not present

## 2021-04-23 DIAGNOSIS — Z01818 Encounter for other preprocedural examination: Secondary | ICD-10-CM | POA: Diagnosis not present

## 2021-04-29 DIAGNOSIS — F411 Generalized anxiety disorder: Secondary | ICD-10-CM | POA: Diagnosis not present

## 2021-05-04 DIAGNOSIS — Z6841 Body Mass Index (BMI) 40.0 and over, adult: Secondary | ICD-10-CM | POA: Diagnosis not present

## 2021-05-04 DIAGNOSIS — Z713 Dietary counseling and surveillance: Secondary | ICD-10-CM | POA: Diagnosis not present

## 2021-05-05 DIAGNOSIS — Z6841 Body Mass Index (BMI) 40.0 and over, adult: Secondary | ICD-10-CM | POA: Diagnosis not present

## 2021-05-13 DIAGNOSIS — F411 Generalized anxiety disorder: Secondary | ICD-10-CM | POA: Diagnosis not present

## 2021-05-14 DIAGNOSIS — I498 Other specified cardiac arrhythmias: Secondary | ICD-10-CM | POA: Diagnosis not present

## 2021-05-14 DIAGNOSIS — R9431 Abnormal electrocardiogram [ECG] [EKG]: Secondary | ICD-10-CM | POA: Diagnosis not present

## 2021-05-14 DIAGNOSIS — Z01810 Encounter for preprocedural cardiovascular examination: Secondary | ICD-10-CM | POA: Diagnosis not present

## 2021-05-14 DIAGNOSIS — Z6841 Body Mass Index (BMI) 40.0 and over, adult: Secondary | ICD-10-CM | POA: Diagnosis not present

## 2021-05-14 DIAGNOSIS — Z01812 Encounter for preprocedural laboratory examination: Secondary | ICD-10-CM | POA: Diagnosis not present

## 2021-05-14 DIAGNOSIS — Z713 Dietary counseling and surveillance: Secondary | ICD-10-CM | POA: Diagnosis not present

## 2021-05-14 DIAGNOSIS — R001 Bradycardia, unspecified: Secondary | ICD-10-CM | POA: Diagnosis not present

## 2021-05-18 DIAGNOSIS — R109 Unspecified abdominal pain: Secondary | ICD-10-CM | POA: Diagnosis not present

## 2021-05-18 DIAGNOSIS — Z6841 Body Mass Index (BMI) 40.0 and over, adult: Secondary | ICD-10-CM | POA: Diagnosis not present

## 2021-05-18 DIAGNOSIS — G8918 Other acute postprocedural pain: Secondary | ICD-10-CM | POA: Diagnosis not present

## 2021-05-18 DIAGNOSIS — Z9109 Other allergy status, other than to drugs and biological substances: Secondary | ICD-10-CM | POA: Diagnosis not present

## 2021-05-18 DIAGNOSIS — Z01818 Encounter for other preprocedural examination: Secondary | ICD-10-CM | POA: Diagnosis not present

## 2021-05-26 DIAGNOSIS — D2239 Melanocytic nevi of other parts of face: Secondary | ICD-10-CM | POA: Diagnosis not present

## 2021-05-26 DIAGNOSIS — L700 Acne vulgaris: Secondary | ICD-10-CM | POA: Diagnosis not present

## 2021-06-03 DIAGNOSIS — F411 Generalized anxiety disorder: Secondary | ICD-10-CM | POA: Diagnosis not present

## 2021-06-17 DIAGNOSIS — F411 Generalized anxiety disorder: Secondary | ICD-10-CM | POA: Diagnosis not present

## 2021-07-01 DIAGNOSIS — F411 Generalized anxiety disorder: Secondary | ICD-10-CM | POA: Diagnosis not present

## 2021-07-02 DIAGNOSIS — M9905 Segmental and somatic dysfunction of pelvic region: Secondary | ICD-10-CM | POA: Diagnosis not present

## 2021-07-02 DIAGNOSIS — M5451 Vertebrogenic low back pain: Secondary | ICD-10-CM | POA: Diagnosis not present

## 2021-07-02 DIAGNOSIS — M9903 Segmental and somatic dysfunction of lumbar region: Secondary | ICD-10-CM | POA: Diagnosis not present

## 2021-07-02 DIAGNOSIS — M9902 Segmental and somatic dysfunction of thoracic region: Secondary | ICD-10-CM | POA: Diagnosis not present

## 2021-07-08 DIAGNOSIS — F411 Generalized anxiety disorder: Secondary | ICD-10-CM | POA: Diagnosis not present

## 2021-07-09 DIAGNOSIS — M9903 Segmental and somatic dysfunction of lumbar region: Secondary | ICD-10-CM | POA: Diagnosis not present

## 2021-07-09 DIAGNOSIS — M9902 Segmental and somatic dysfunction of thoracic region: Secondary | ICD-10-CM | POA: Diagnosis not present

## 2021-07-09 DIAGNOSIS — M5451 Vertebrogenic low back pain: Secondary | ICD-10-CM | POA: Diagnosis not present

## 2021-07-09 DIAGNOSIS — M9905 Segmental and somatic dysfunction of pelvic region: Secondary | ICD-10-CM | POA: Diagnosis not present

## 2021-07-15 DIAGNOSIS — F411 Generalized anxiety disorder: Secondary | ICD-10-CM | POA: Diagnosis not present

## 2021-07-16 DIAGNOSIS — M9903 Segmental and somatic dysfunction of lumbar region: Secondary | ICD-10-CM | POA: Diagnosis not present

## 2021-07-16 DIAGNOSIS — M9902 Segmental and somatic dysfunction of thoracic region: Secondary | ICD-10-CM | POA: Diagnosis not present

## 2021-07-16 DIAGNOSIS — M9905 Segmental and somatic dysfunction of pelvic region: Secondary | ICD-10-CM | POA: Diagnosis not present

## 2021-07-16 DIAGNOSIS — M5451 Vertebrogenic low back pain: Secondary | ICD-10-CM | POA: Diagnosis not present

## 2021-07-22 DIAGNOSIS — F411 Generalized anxiety disorder: Secondary | ICD-10-CM | POA: Diagnosis not present

## 2021-07-30 DIAGNOSIS — M9903 Segmental and somatic dysfunction of lumbar region: Secondary | ICD-10-CM | POA: Diagnosis not present

## 2021-07-30 DIAGNOSIS — M9905 Segmental and somatic dysfunction of pelvic region: Secondary | ICD-10-CM | POA: Diagnosis not present

## 2021-07-30 DIAGNOSIS — M5451 Vertebrogenic low back pain: Secondary | ICD-10-CM | POA: Diagnosis not present

## 2021-07-30 DIAGNOSIS — M9902 Segmental and somatic dysfunction of thoracic region: Secondary | ICD-10-CM | POA: Diagnosis not present

## 2021-07-31 DIAGNOSIS — L700 Acne vulgaris: Secondary | ICD-10-CM | POA: Diagnosis not present

## 2021-07-31 DIAGNOSIS — D2239 Melanocytic nevi of other parts of face: Secondary | ICD-10-CM | POA: Diagnosis not present

## 2021-09-08 DIAGNOSIS — Z6834 Body mass index (BMI) 34.0-34.9, adult: Secondary | ICD-10-CM | POA: Diagnosis not present

## 2021-09-08 DIAGNOSIS — Z48815 Encounter for surgical aftercare following surgery on the digestive system: Secondary | ICD-10-CM | POA: Diagnosis not present

## 2021-11-13 DIAGNOSIS — K909 Intestinal malabsorption, unspecified: Secondary | ICD-10-CM | POA: Diagnosis not present

## 2021-12-14 DIAGNOSIS — Z6829 Body mass index (BMI) 29.0-29.9, adult: Secondary | ICD-10-CM | POA: Diagnosis not present

## 2021-12-14 DIAGNOSIS — B351 Tinea unguium: Secondary | ICD-10-CM | POA: Diagnosis not present

## 2022-02-12 DIAGNOSIS — Z6827 Body mass index (BMI) 27.0-27.9, adult: Secondary | ICD-10-CM | POA: Diagnosis not present

## 2022-02-12 DIAGNOSIS — Z113 Encounter for screening for infections with a predominantly sexual mode of transmission: Secondary | ICD-10-CM | POA: Diagnosis not present

## 2022-02-12 DIAGNOSIS — Z01419 Encounter for gynecological examination (general) (routine) without abnormal findings: Secondary | ICD-10-CM | POA: Diagnosis not present

## 2022-02-12 DIAGNOSIS — Z9884 Bariatric surgery status: Secondary | ICD-10-CM | POA: Insufficient documentation

## 2022-08-30 DIAGNOSIS — Z79899 Other long term (current) drug therapy: Secondary | ICD-10-CM | POA: Diagnosis not present

## 2022-08-30 DIAGNOSIS — Z9884 Bariatric surgery status: Secondary | ICD-10-CM | POA: Diagnosis not present

## 2022-10-25 DIAGNOSIS — Z9884 Bariatric surgery status: Secondary | ICD-10-CM | POA: Diagnosis not present

## 2022-10-25 DIAGNOSIS — K909 Intestinal malabsorption, unspecified: Secondary | ICD-10-CM | POA: Diagnosis not present

## 2022-10-25 DIAGNOSIS — Z79899 Other long term (current) drug therapy: Secondary | ICD-10-CM | POA: Diagnosis not present

## 2022-11-18 DIAGNOSIS — Z1231 Encounter for screening mammogram for malignant neoplasm of breast: Secondary | ICD-10-CM | POA: Diagnosis not present

## 2022-12-09 DIAGNOSIS — R278 Other lack of coordination: Secondary | ICD-10-CM | POA: Diagnosis not present

## 2022-12-09 DIAGNOSIS — N819 Female genital prolapse, unspecified: Secondary | ICD-10-CM | POA: Diagnosis not present

## 2022-12-09 DIAGNOSIS — M6281 Muscle weakness (generalized): Secondary | ICD-10-CM | POA: Diagnosis not present

## 2022-12-09 DIAGNOSIS — K5900 Constipation, unspecified: Secondary | ICD-10-CM | POA: Diagnosis not present

## 2022-12-24 DIAGNOSIS — K649 Unspecified hemorrhoids: Secondary | ICD-10-CM | POA: Diagnosis not present

## 2022-12-30 DIAGNOSIS — R278 Other lack of coordination: Secondary | ICD-10-CM | POA: Diagnosis not present

## 2022-12-30 DIAGNOSIS — M6281 Muscle weakness (generalized): Secondary | ICD-10-CM | POA: Diagnosis not present

## 2022-12-30 DIAGNOSIS — K5900 Constipation, unspecified: Secondary | ICD-10-CM | POA: Diagnosis not present

## 2022-12-30 DIAGNOSIS — N819 Female genital prolapse, unspecified: Secondary | ICD-10-CM | POA: Diagnosis not present

## 2023-01-27 DIAGNOSIS — N819 Female genital prolapse, unspecified: Secondary | ICD-10-CM | POA: Diagnosis not present

## 2023-01-27 DIAGNOSIS — K5900 Constipation, unspecified: Secondary | ICD-10-CM | POA: Diagnosis not present

## 2023-01-27 DIAGNOSIS — M6281 Muscle weakness (generalized): Secondary | ICD-10-CM | POA: Diagnosis not present

## 2023-01-27 DIAGNOSIS — R278 Other lack of coordination: Secondary | ICD-10-CM | POA: Diagnosis not present

## 2023-03-03 DIAGNOSIS — Z01419 Encounter for gynecological examination (general) (routine) without abnormal findings: Secondary | ICD-10-CM | POA: Diagnosis not present

## 2023-03-03 DIAGNOSIS — Z124 Encounter for screening for malignant neoplasm of cervix: Secondary | ICD-10-CM | POA: Diagnosis not present

## 2023-03-24 DIAGNOSIS — E663 Overweight: Secondary | ICD-10-CM | POA: Diagnosis not present

## 2023-03-24 DIAGNOSIS — Z9884 Bariatric surgery status: Secondary | ICD-10-CM | POA: Diagnosis not present

## 2023-03-24 DIAGNOSIS — Z6826 Body mass index (BMI) 26.0-26.9, adult: Secondary | ICD-10-CM | POA: Diagnosis not present

## 2023-03-24 DIAGNOSIS — Z48815 Encounter for surgical aftercare following surgery on the digestive system: Secondary | ICD-10-CM | POA: Diagnosis not present

## 2023-03-31 DIAGNOSIS — F988 Other specified behavioral and emotional disorders with onset usually occurring in childhood and adolescence: Secondary | ICD-10-CM | POA: Diagnosis not present

## 2023-03-31 DIAGNOSIS — Z6826 Body mass index (BMI) 26.0-26.9, adult: Secondary | ICD-10-CM | POA: Diagnosis not present

## 2023-09-20 DIAGNOSIS — F4322 Adjustment disorder with anxiety: Secondary | ICD-10-CM | POA: Diagnosis not present

## 2023-09-26 DIAGNOSIS — L719 Rosacea, unspecified: Secondary | ICD-10-CM | POA: Diagnosis not present

## 2023-10-05 NOTE — L&D Delivery Note (Signed)
   Delivery Note:   [id number] at [redacted]w[redacted]d  Admitting diagnosis: Normal labor and delivery [O80] Risks: History of bariatric surgery; anemia, s/p Venofer ; AMA; RNI Onset of labor: 09/11/2024 at 0000 IOL/Augmentation: Pitocin  ROM: 09/11/2024 at 0000  Complete dilation at 09/11/2024 0643 Onset of pushing at 0643 FHR second stage Cat I  Analgesia/Anesthesia intrapartum:Epidural Pushing in lithotomy position with CNM and L&D staff support. Husband, Alm, present for birth and supportive.  Delivery of a Live born female  Birth Weight:  pending APGAR: 9, 9   Newborn Delivery   Birth date/time: 09/11/2024 06:47:10 Delivery type: Vaginal, Spontaneous    in cephalic presentation, position OA to ROA.  APGAR:1 min-9 , 5 min-9   Nuchal Cord: No  Cord double clamped after cessation of pulsation, cut by RN.  Collection of cord blood for typing completed. Arterial cord blood sample-No   Placenta delivered-Spontaneous with 3 vessels. Uterotonics: Pitocin  Placenta to L&D Uterine tone firm  Bleeding small  None laceration identified.  Episiotomy:None Local analgesia: N/A  Repair: N/A Est. Blood Loss (mL):150.00  Complications: None  Mom to postpartum. Baby Adalyn to Couplet care / Skin to Skin.  Delivery Report:   Review the Delivery Report for details.    Alan MARLA Molt, CNM, MSN 09/11/2024, 7:20 AM

## 2023-10-31 DIAGNOSIS — F4322 Adjustment disorder with anxiety: Secondary | ICD-10-CM | POA: Diagnosis not present

## 2023-11-07 DIAGNOSIS — F4322 Adjustment disorder with anxiety: Secondary | ICD-10-CM | POA: Diagnosis not present

## 2023-12-05 DIAGNOSIS — F4322 Adjustment disorder with anxiety: Secondary | ICD-10-CM | POA: Diagnosis not present

## 2023-12-15 DIAGNOSIS — Z79899 Other long term (current) drug therapy: Secondary | ICD-10-CM | POA: Diagnosis not present

## 2023-12-15 DIAGNOSIS — K909 Intestinal malabsorption, unspecified: Secondary | ICD-10-CM | POA: Diagnosis not present

## 2023-12-15 DIAGNOSIS — Z9884 Bariatric surgery status: Secondary | ICD-10-CM | POA: Diagnosis not present

## 2023-12-19 DIAGNOSIS — Z9884 Bariatric surgery status: Secondary | ICD-10-CM | POA: Diagnosis not present

## 2023-12-19 DIAGNOSIS — Z48815 Encounter for surgical aftercare following surgery on the digestive system: Secondary | ICD-10-CM | POA: Diagnosis not present

## 2023-12-19 DIAGNOSIS — K219 Gastro-esophageal reflux disease without esophagitis: Secondary | ICD-10-CM | POA: Diagnosis not present

## 2023-12-19 DIAGNOSIS — R7989 Other specified abnormal findings of blood chemistry: Secondary | ICD-10-CM | POA: Diagnosis not present

## 2023-12-26 DIAGNOSIS — L738 Other specified follicular disorders: Secondary | ICD-10-CM | POA: Diagnosis not present

## 2023-12-26 DIAGNOSIS — L739 Follicular disorder, unspecified: Secondary | ICD-10-CM | POA: Diagnosis not present

## 2024-01-02 DIAGNOSIS — F4322 Adjustment disorder with anxiety: Secondary | ICD-10-CM | POA: Diagnosis not present

## 2024-01-18 DIAGNOSIS — Z3689 Encounter for other specified antenatal screening: Secondary | ICD-10-CM | POA: Diagnosis not present

## 2024-01-18 DIAGNOSIS — Z3200 Encounter for pregnancy test, result unknown: Secondary | ICD-10-CM | POA: Diagnosis not present

## 2024-02-13 DIAGNOSIS — E611 Iron deficiency: Secondary | ICD-10-CM | POA: Diagnosis not present

## 2024-02-13 DIAGNOSIS — Z3201 Encounter for pregnancy test, result positive: Secondary | ICD-10-CM | POA: Diagnosis not present

## 2024-02-13 DIAGNOSIS — O99841 Bariatric surgery status complicating pregnancy, first trimester: Secondary | ICD-10-CM | POA: Diagnosis not present

## 2024-02-13 DIAGNOSIS — Z3A01 Less than 8 weeks gestation of pregnancy: Secondary | ICD-10-CM | POA: Diagnosis not present

## 2024-02-14 ENCOUNTER — Other Ambulatory Visit: Payer: Self-pay | Admitting: Obstetrics

## 2024-02-14 DIAGNOSIS — R7401 Elevation of levels of liver transaminase levels: Secondary | ICD-10-CM

## 2024-02-21 ENCOUNTER — Other Ambulatory Visit: Payer: Self-pay | Admitting: Certified Nurse Midwife

## 2024-02-21 DIAGNOSIS — D508 Other iron deficiency anemias: Secondary | ICD-10-CM

## 2024-02-24 ENCOUNTER — Non-Acute Institutional Stay (HOSPITAL_COMMUNITY)
Admission: RE | Admit: 2024-02-24 | Discharge: 2024-02-24 | Disposition: A | Discharge status: Home / Self Care | Source: Ambulatory Visit | Attending: Internal Medicine | Admitting: Internal Medicine

## 2024-02-24 DIAGNOSIS — D509 Iron deficiency anemia, unspecified: Secondary | ICD-10-CM | POA: Diagnosis present

## 2024-02-24 DIAGNOSIS — D508 Other iron deficiency anemias: Secondary | ICD-10-CM | POA: Insufficient documentation

## 2024-02-24 MED ORDER — IRON SUCROSE 500 MG IVPB - SIMPLE MED
500.0000 mg | INTRAVENOUS | Status: DC
  Administered 2024-02-24: 500 mg via INTRAVENOUS
  Filled 2024-02-24: qty 500

## 2024-02-24 MED ORDER — ACETAMINOPHEN 500 MG PO TABS
500.0000 mg | ORAL_TABLET | ORAL | Status: DC
  Administered 2024-02-24: 500 mg via ORAL
  Filled 2024-02-24: qty 1

## 2024-02-24 MED ORDER — DIPHENHYDRAMINE HCL 25 MG PO CAPS
25.0000 mg | ORAL_CAPSULE | ORAL | Status: DC
  Administered 2024-02-24: 25 mg via ORAL
  Filled 2024-02-24: qty 1

## 2024-02-24 MED ORDER — SODIUM CHLORIDE 0.9 % IV SOLN
INTRAVENOUS | Status: DC | PRN
Start: 2024-02-24 — End: 2024-02-25

## 2024-02-24 NOTE — Progress Notes (Signed)
 PATIENT CARE CENTER NOTE   Diagnosis: Iron deficiency anemia secondary to inadequate dietary iron intake [D50.8]    Provider: Warren Haber, CNM   Procedure: Venofer infusion    Note: Patient received Venofer 500 mg infusion (dose # 1 of 2) via PIV. Pre-medicated patient with PO Tylenol  and Benadryl  per order. Patient tolerated infusion well with no adverse reaction.  Vital signs stable. Discharge instructions given. Patient to come back in 2 weeks and will schedule next appointment at the front desk. Patient alert, oriented and ambulatory at discharge.

## 2024-02-28 DIAGNOSIS — H6992 Unspecified Eustachian tube disorder, left ear: Secondary | ICD-10-CM | POA: Diagnosis not present

## 2024-02-28 DIAGNOSIS — K602 Anal fissure, unspecified: Secondary | ICD-10-CM | POA: Diagnosis not present

## 2024-03-05 DIAGNOSIS — L720 Epidermal cyst: Secondary | ICD-10-CM | POA: Diagnosis not present

## 2024-03-05 DIAGNOSIS — L700 Acne vulgaris: Secondary | ICD-10-CM | POA: Diagnosis not present

## 2024-03-05 DIAGNOSIS — D369 Benign neoplasm, unspecified site: Secondary | ICD-10-CM | POA: Diagnosis not present

## 2024-03-05 DIAGNOSIS — D229 Melanocytic nevi, unspecified: Secondary | ICD-10-CM | POA: Diagnosis not present

## 2024-03-06 DIAGNOSIS — Z3689 Encounter for other specified antenatal screening: Secondary | ICD-10-CM | POA: Diagnosis not present

## 2024-03-06 DIAGNOSIS — Z8632 Personal history of gestational diabetes: Secondary | ICD-10-CM | POA: Insufficient documentation

## 2024-03-06 DIAGNOSIS — Z8759 Personal history of other complications of pregnancy, childbirth and the puerperium: Secondary | ICD-10-CM | POA: Insufficient documentation

## 2024-03-06 DIAGNOSIS — Z3A10 10 weeks gestation of pregnancy: Secondary | ICD-10-CM | POA: Diagnosis not present

## 2024-03-06 DIAGNOSIS — O3680X0 Pregnancy with inconclusive fetal viability, not applicable or unspecified: Secondary | ICD-10-CM | POA: Diagnosis not present

## 2024-03-06 DIAGNOSIS — O99841 Bariatric surgery status complicating pregnancy, first trimester: Secondary | ICD-10-CM | POA: Diagnosis not present

## 2024-03-06 DIAGNOSIS — Z1331 Encounter for screening for depression: Secondary | ICD-10-CM | POA: Diagnosis not present

## 2024-03-06 LAB — OB RESULTS CONSOLE GC/CHLAMYDIA
Chlamydia: NEGATIVE
Neisseria Gonorrhea: NEGATIVE

## 2024-03-08 ENCOUNTER — Non-Acute Institutional Stay (HOSPITAL_COMMUNITY)
Admission: RE | Admit: 2024-03-08 | Discharge: 2024-03-08 | Disposition: A | Discharge status: Home / Self Care | Source: Ambulatory Visit | Attending: Internal Medicine | Admitting: Internal Medicine

## 2024-03-08 DIAGNOSIS — D508 Other iron deficiency anemias: Secondary | ICD-10-CM | POA: Insufficient documentation

## 2024-03-08 MED ORDER — ACETAMINOPHEN 500 MG PO TABS
500.0000 mg | ORAL_TABLET | Freq: Once | ORAL | Status: AC
  Administered 2024-03-08: 500 mg via ORAL
  Filled 2024-03-08: qty 1

## 2024-03-08 MED ORDER — SODIUM CHLORIDE 0.9 % IV SOLN: INTRAVENOUS | Status: DC | PRN

## 2024-03-08 MED ORDER — DIPHENHYDRAMINE HCL 25 MG PO CAPS
25.0000 mg | ORAL_CAPSULE | Freq: Once | ORAL | Status: AC
  Administered 2024-03-08: 25 mg via ORAL
  Filled 2024-03-08: qty 1

## 2024-03-08 MED ORDER — IRON SUCROSE 500 MG IVPB - SIMPLE MED
500.0000 mg | Freq: Once | INTRAVENOUS | Status: AC
  Administered 2024-03-08: 500 mg via INTRAVENOUS
  Filled 2024-03-08: qty 500

## 2024-03-08 NOTE — Progress Notes (Signed)
 PATIENT CARE CENTER NOTE  Diagnosis: Iron  deficiency anemia secondary to inadequate dietary iron  intake [D50.8]    Provider: Warren Haber, CNM     Procedure: Venofer  500 mg   Note: Patient received Venofer  500 mg infusion (dose #2 of 2) via PIV. Pt pre-medicated with PO Tylenol  and Benadryl  per orders. During infusion, pts PIV dislodged. infusion paused, and PIV removed. New PIV placed and infusion resumed. Pt tolerated the infusion with no adverse reaction. Vital signs are stable. AVS offered, but pt declined. Pt is alert, oriented, and ambulatory at discharge.

## 2024-03-09 DIAGNOSIS — H9192 Unspecified hearing loss, left ear: Secondary | ICD-10-CM | POA: Diagnosis not present

## 2024-03-09 DIAGNOSIS — H9312 Tinnitus, left ear: Secondary | ICD-10-CM | POA: Diagnosis not present

## 2024-04-02 DIAGNOSIS — Z8632 Personal history of gestational diabetes: Secondary | ICD-10-CM | POA: Diagnosis not present

## 2024-04-02 DIAGNOSIS — Z3689 Encounter for other specified antenatal screening: Secondary | ICD-10-CM | POA: Diagnosis not present

## 2024-04-02 LAB — HEPATITIS C ANTIBODY: HCV Ab: NEGATIVE

## 2024-04-02 LAB — OB RESULTS CONSOLE HEPATITIS B SURFACE ANTIGEN: Hepatitis B Surface Ag: NEGATIVE

## 2024-04-02 LAB — OB RESULTS CONSOLE HIV ANTIBODY (ROUTINE TESTING): HIV: NONREACTIVE

## 2024-04-02 LAB — OB RESULTS CONSOLE RUBELLA ANTIBODY, IGM: Rubella: NON-IMMUNE/NOT IMMUNE

## 2024-04-03 DIAGNOSIS — Z789 Other specified health status: Secondary | ICD-10-CM | POA: Insufficient documentation

## 2024-04-24 DIAGNOSIS — K909 Intestinal malabsorption, unspecified: Secondary | ICD-10-CM | POA: Diagnosis not present

## 2024-04-24 DIAGNOSIS — Z9884 Bariatric surgery status: Secondary | ICD-10-CM | POA: Diagnosis not present

## 2024-04-24 DIAGNOSIS — Z79899 Other long term (current) drug therapy: Secondary | ICD-10-CM | POA: Diagnosis not present

## 2024-05-03 DIAGNOSIS — Z3A18 18 weeks gestation of pregnancy: Secondary | ICD-10-CM | POA: Diagnosis not present

## 2024-05-03 DIAGNOSIS — Z8632 Personal history of gestational diabetes: Secondary | ICD-10-CM | POA: Diagnosis not present

## 2024-05-03 DIAGNOSIS — Z9884 Bariatric surgery status: Secondary | ICD-10-CM | POA: Diagnosis not present

## 2024-05-03 DIAGNOSIS — O09522 Supervision of elderly multigravida, second trimester: Secondary | ICD-10-CM | POA: Diagnosis not present

## 2024-05-07 DIAGNOSIS — H6992 Unspecified Eustachian tube disorder, left ear: Secondary | ICD-10-CM | POA: Diagnosis not present

## 2024-05-09 DIAGNOSIS — K641 Second degree hemorrhoids: Secondary | ICD-10-CM | POA: Diagnosis not present

## 2024-05-17 DIAGNOSIS — Z9884 Bariatric surgery status: Secondary | ICD-10-CM | POA: Diagnosis not present

## 2024-05-17 DIAGNOSIS — Z48815 Encounter for surgical aftercare following surgery on the digestive system: Secondary | ICD-10-CM | POA: Diagnosis not present

## 2024-07-05 DIAGNOSIS — Z8632 Personal history of gestational diabetes: Secondary | ICD-10-CM | POA: Diagnosis not present

## 2024-07-23 DIAGNOSIS — Z3483 Encounter for supervision of other normal pregnancy, third trimester: Secondary | ICD-10-CM | POA: Diagnosis not present

## 2024-07-23 DIAGNOSIS — Z3689 Encounter for other specified antenatal screening: Secondary | ICD-10-CM | POA: Diagnosis not present

## 2024-07-23 LAB — OB RESULTS CONSOLE RPR: RPR: NONREACTIVE

## 2024-07-24 ENCOUNTER — Other Ambulatory Visit: Payer: Self-pay | Admitting: Certified Nurse Midwife

## 2024-07-24 DIAGNOSIS — O99013 Anemia complicating pregnancy, third trimester: Secondary | ICD-10-CM

## 2024-07-25 ENCOUNTER — Other Ambulatory Visit (HOSPITAL_COMMUNITY): Payer: Self-pay | Admitting: Certified Nurse Midwife

## 2024-07-25 ENCOUNTER — Telehealth (HOSPITAL_COMMUNITY): Payer: Self-pay | Admitting: Pharmacy Technician

## 2024-07-25 DIAGNOSIS — O99013 Anemia complicating pregnancy, third trimester: Secondary | ICD-10-CM | POA: Insufficient documentation

## 2024-07-25 NOTE — Telephone Encounter (Incomplete)
 Auth Submission: PENDING Site of care: [facility] Payer: [employer]  Medication & CPT/J Code(s) submitted: Venofer  (Iron  Sucrose) J1756 Diagnosis Code: O99.013 Route of submission (phone, fax, portal):  Phone # [phone] Fax # [phone] Auth type: Buy/Bill HB Units/visits requested: 300mg  x 3 doses Reference number: [id number] Approval from: *** to ***    Dagoberto Armour, CPhT [facility] Phone: [phone] 07/25/2024

## 2024-08-06 ENCOUNTER — Ambulatory Visit (HOSPITAL_COMMUNITY)
Admission: RE | Admit: 2024-08-06 | Discharge: 2024-08-06 | Disposition: A | Discharge status: Home / Self Care | Source: Ambulatory Visit | Attending: Certified Nurse Midwife | Admitting: Certified Nurse Midwife

## 2024-08-06 VITALS — BP 106/56 | HR 81 | Temp 97.8°F | Resp 16

## 2024-08-06 DIAGNOSIS — Z3A00 Weeks of gestation of pregnancy not specified: Secondary | ICD-10-CM | POA: Insufficient documentation

## 2024-08-06 DIAGNOSIS — O99013 Anemia complicating pregnancy, third trimester: Secondary | ICD-10-CM | POA: Insufficient documentation

## 2024-08-06 MED ORDER — SODIUM CHLORIDE 0.9 % IV SOLN: INTRAVENOUS | Status: DC | PRN

## 2024-08-06 MED ORDER — DIPHENHYDRAMINE HCL 25 MG PO CAPS
25.0000 mg | ORAL_CAPSULE | Freq: Once | ORAL | Status: AC
  Administered 2024-08-06: 25 mg via ORAL
  Filled 2024-08-06: qty 1

## 2024-08-06 MED ORDER — ACETAMINOPHEN 325 MG PO TABS
650.0000 mg | ORAL_TABLET | Freq: Once | ORAL | Status: AC
  Administered 2024-08-06: 650 mg via ORAL
  Filled 2024-08-06: qty 2

## 2024-08-06 MED ORDER — IRON SUCROSE 300 MG IVPB - SIMPLE MED
300.0000 mg | Freq: Once | Status: AC
  Administered 2024-08-06: 300 mg via INTRAVENOUS
  Filled 2024-08-06: qty 300

## 2024-08-06 NOTE — Progress Notes (Addendum)
 PATIENT CARE CENTER NOTE   Diagnosis: Anemia complicating pregnancy in third trimester [O99.013]    Provider: Joshua Alan POUR, CNM    Procedure: Venofer  300 mg (dose # 1 of 3)   Note: Patient received Venofer  infusion (dose  # 1 of 3) via PIV. Patient pre-medicated with PO Tylenol  and Benadryl  per order. Patient tolerated infusion well with no adverse reaction. Patient declined to wait for the 30 minutes post infusion observation. Vital signs stable. AVS offered but patient declined. Patient to come back in 1 week and will scheduled next appointment at the front desk. Patient alert, oriented and ambulatory at discharge.

## 2024-08-09 DIAGNOSIS — O99843 Bariatric surgery status complicating pregnancy, third trimester: Secondary | ICD-10-CM | POA: Diagnosis not present

## 2024-08-09 DIAGNOSIS — Z3A32 32 weeks gestation of pregnancy: Secondary | ICD-10-CM | POA: Diagnosis not present

## 2024-08-14 ENCOUNTER — Ambulatory Visit (HOSPITAL_COMMUNITY)
Admission: RE | Admit: 2024-08-14 | Discharge: 2024-08-14 | Disposition: A | Discharge status: Home / Self Care | Source: Ambulatory Visit | Attending: Internal Medicine | Admitting: Internal Medicine

## 2024-08-14 VITALS — BP 107/58 | HR 69 | Temp 97.8°F

## 2024-08-14 DIAGNOSIS — O99013 Anemia complicating pregnancy, third trimester: Secondary | ICD-10-CM

## 2024-08-14 MED ORDER — ACETAMINOPHEN 325 MG PO TABS
650.0000 mg | ORAL_TABLET | Freq: Once | ORAL | Status: AC
  Administered 2024-08-14: 650 mg via ORAL
  Filled 2024-08-14: qty 2

## 2024-08-14 MED ORDER — DIPHENHYDRAMINE HCL 25 MG PO CAPS
25.0000 mg | ORAL_CAPSULE | Freq: Once | ORAL | Status: AC
  Administered 2024-08-14: 25 mg via ORAL
  Filled 2024-08-14: qty 1

## 2024-08-14 MED ORDER — SODIUM CHLORIDE 0.9 % IV SOLN
300.0000 mg | Freq: Once | INTRAVENOUS | Status: AC
  Administered 2024-08-14: 300 mg via INTRAVENOUS
  Filled 2024-08-14: qty 15

## 2024-08-14 MED ORDER — ACETAMINOPHEN 325 MG PO TABS
ORAL_TABLET | ORAL | Status: AC
  Filled 2024-08-14: qty 1

## 2024-08-14 MED ORDER — SODIUM CHLORIDE 0.9 % IV SOLN: INTRAVENOUS | Status: DC | PRN

## 2024-08-14 NOTE — Progress Notes (Signed)
 Diagnosis: Anemia complicationj pregnancy in third trimester (099.013)    Provider: Joshua Palma CNM    Procedure:Venofer  300 mg ( dose 2 of 3)    Note: Patient received Venofer  infusion  ( dose 2 of 3) via PIV.  Patient pre medicated with PO tylenol  and Benadryl  per order.  Patient tolerated infusion well with no adverse reaction.  Patient celined to wait 30 minutes post infusion observation.  Vital signs stable .  Patient to return in 1 week for final 3 of 3 infusion.  Alert and oriented, ambulatory at discharge.

## 2024-08-16 ENCOUNTER — Inpatient Hospital Stay (HOSPITAL_COMMUNITY)
Admission: AD | Admit: 2024-08-16 | Discharge: 2024-08-16 | Disposition: A | Discharge status: Home / Self Care | Attending: Obstetrics and Gynecology | Admitting: Obstetrics and Gynecology

## 2024-08-16 ENCOUNTER — Encounter (HOSPITAL_COMMUNITY): Payer: Self-pay | Admitting: *Deleted

## 2024-08-16 ENCOUNTER — Other Ambulatory Visit: Payer: Self-pay

## 2024-08-16 DIAGNOSIS — Z8759 Personal history of other complications of pregnancy, childbirth and the puerperium: Secondary | ICD-10-CM | POA: Insufficient documentation

## 2024-08-16 DIAGNOSIS — L719 Rosacea, unspecified: Secondary | ICD-10-CM | POA: Insufficient documentation

## 2024-08-16 DIAGNOSIS — Z3689 Encounter for other specified antenatal screening: Secondary | ICD-10-CM

## 2024-08-16 DIAGNOSIS — Z3493 Encounter for supervision of normal pregnancy, unspecified, third trimester: Secondary | ICD-10-CM

## 2024-08-16 DIAGNOSIS — Z3A33 33 weeks gestation of pregnancy: Secondary | ICD-10-CM

## 2024-08-16 DIAGNOSIS — E282 Polycystic ovarian syndrome: Secondary | ICD-10-CM | POA: Insufficient documentation

## 2024-08-16 DIAGNOSIS — O368130 Decreased fetal movements, third trimester, not applicable or unspecified: Secondary | ICD-10-CM | POA: Diagnosis not present

## 2024-08-16 NOTE — Discharge Instructions (Signed)
 What is a fetal movement count?  A fetal movement count  is the number of times that you feel your baby move during a certain amount of time. This may also be called a fetal kick count by some people, but fetal movement count is a more accurate term. Movements can be kicks, flutters, swishes, rolls, or jabs. A fetal movement count is recommended for every pregnant woman. You may be asked to start counting fetal movements as early as week 28 of your pregnancy. Pay attention to when your baby is most active. Your baby has times of activity and times to sleep just like you do! You may notice your baby's sleep and wake cycles. You may also notice things that make your baby move more. You should do a fetal movement count: When your baby is normally most active. At the same time each day. A good time to count movements is while you are resting, after having something to eat and drink.  How do I count fetal movements? Find a quiet, comfortable area. Sit, or lie down on your side. Write down the date, the start time and stop time, and the number of movements that you felt between those two times. Take this information with you to your health care visits. Write down your start time when you feel the first movement. Count kicks, flutters, swishes, rolls, and jabs. You should feel at least 10 movements. You may stop counting after you have felt 10 movements, or if you have been counting for 2 hours. Write down the stop time. If you do not feel 10 movements in 2 hours, contact your health care provider for further instructions. Your health care provider may want to do additional tests to assess your baby's well-being. Contact a health care provider if: You feel fewer than 10 movements in 2 hours. Your baby is not moving like he or she usually does.

## 2024-08-16 NOTE — MAU Note (Signed)
 Patricia Bailey is a 38 y.o. at [redacted]w[redacted]d here in MAU reporting: decreased fetal movement starting around 8-9am. Denies bleeding, leaking of fluid, abdominal pain/contractions.  Onset of complaint: 8-9am on 08/16/24 Pain score: 0/10 Vitals:   08/16/24 1856  BP: 117/70  Pulse: 72  Resp: 17  Temp: 98.3 F (36.8 C)     FHT: 132bpm

## 2024-08-16 NOTE — MAU Provider Note (Signed)
 Chief Complaint:  Decreased Fetal Movement   HPI   None     Patricia Bailey is a 38 y.o. G4P3003 at [redacted]w[redacted]d who presents to maternity admissions reporting decreased fetal movement. She reports less fetal movement than usual starting at around 0800 or 0900. Denies vaginal bleeding, leaking of fluid, contractions.   Pregnancy Course: Receives care at [facility]. Prenatal records reviewed.  Past Medical History:  Diagnosis Date   Diabetes mellitus    Gestational diabetes    glyburide   History of chicken pox    Obesity    Pregnancy induced hypertension    OB History  Gravida Para Term Preterm AB Living  4 3 3  0 0 3  SAB IAB Ectopic Multiple Live Births  0 0 0 0 3    # Outcome Date GA Lbr Len/2nd Weight Sex Type Anes PTL Lv  4 Current           3 Term 07/19/20 [redacted]w[redacted]d / 00:14 3345 g M Vag-Spont EPI  LIV  2 Term 12/16/11 [redacted]w[redacted]d 110:49 / 00:23 3515 g F Vag-Spont EPI  LIV     Birth Comments: wnl  1 Term 2007 [redacted]w[redacted]d  3118 g F Vag-Spont EPI  LIV   Past Surgical History:  Procedure Laterality Date   Sadi Procedure  2022   No family history on file. Social History   Tobacco Use   Smoking status: Never   Smokeless tobacco: Never  Vaping Use   Vaping status: Never Used  Substance Use Topics   Alcohol use: Not Currently   Drug use: No   Allergies  Allergen Reactions   Stadol [Butorphanol Tartrate]     Patient states that stadol makes her itchy and delusional   Medications Prior to Admission  Medication Sig Dispense Refill Last Dose/Taking   Ascorbic Acid (VITAMIN C) 1000 MG tablet Take 1,000 mg by mouth daily.   Past Month   ferrous sulfate 325 (65 FE) MG tablet Take 325 mg by mouth daily with breakfast.   Past Month   magnesium 30 MG tablet Take 30 mg by mouth daily.    Past Month   omeprazole (PRILOSEC) 20 MG capsule Take 20 mg by mouth daily.   08/15/2024   Prenatal Vit-Fe Fumarate-FA (MULTIVITAMIN-PRENATAL) 27-0.8 MG TABS Take 1 tablet by mouth daily.   Past Month    Probiotic Product (PROBIOTIC-10 PO) Take 1 tablet by mouth daily.   Past Month   Pyridoxine HCl (VITAMIN B-6) 500 MG tablet Take 500 mg by mouth daily.   Past Month    I have reviewed patient's Past Medical Hx, Surgical Hx, Family Hx, Social Hx, medications and allergies.   ROS  Pertinent items noted in HPI and remainder of comprehensive ROS otherwise negative.   PHYSICAL EXAM  Patient Vitals for the past 24 hrs:  BP Temp Temp src Pulse Resp Height Weight  08/16/24 1856 117/70 98.3 F (36.8 C) Oral 72 17 5' 7 (1.702 m) 82.3 kg    Constitutional: Well-developed, well-nourished female in no acute distress.  HEENT: atraumatic, normocephalic. Neck has normal ROM. EOM intact. Cardiovascular: normal rate & rhythm, warm and well-perfused Respiratory: normal effort, no problems with respiration noted GI: Abd soft, non-tender, non-distended MSK: Extremities nontender, no edema, normal ROM Skin: warm and dry. Acyanotic, no jaundice or pallor. Neurologic: Alert and oriented x 4. No abnormal coordination. Psychiatric: Normal mood. Speech not slurred, not rapid/pressured. Patient is cooperative.  Fetal Tracing: Baseline FHR: 135 per minute Fetal heart variability: moderate  Fetal Heart Rate accelerations: yes Fetal Heart Rate decelerations: none Fetal Non-stress Test: Category I (reactive) Toco: no uterine contractions   Labs: No results found for this or any previous visit (from the past 24 hours).  Imaging:  No results found.  MDM & MAU COURSE  MDM: Moderate  MAU Course: -Vital signs within normal limits. -Patient endorses feeling fetal movement while in MAU. -Reactive NST.  Differential diagnosis considered for decreased fetal movement includes but is not limited to: fetal sleep, poor maternal perception of movement, medications, early gestational age, decreased/increased amniotic fluid volume, maternal position (sitting or standing versus lying), fetal position (anterior  position of the fetal spine), anterior placenta, maternal physical activity   Orders Placed This Encounter  Procedures   Discharge patient   No orders of the defined types were placed in this encounter.  ASSESSMENT   1. Movement of fetus present during pregnancy in third trimester   2. NST (non-stress test) reactive   3. [redacted] weeks gestation of pregnancy     PLAN  Discharge home in stable condition with return precautions.  Discussed fetal movement counts and provided additional information in AVS.     Allergies as of 08/16/2024       Reactions   Stadol [butorphanol Tartrate]    Patient states that stadol makes her itchy and delusional        Medication List     TAKE these medications    ferrous sulfate 325 (65 FE) MG tablet Take 325 mg by mouth daily with breakfast.   magnesium 30 MG tablet Take 30 mg by mouth daily.   multivitamin-prenatal 27-0.8 MG Tabs tablet Take 1 tablet by mouth daily.   omeprazole 20 MG capsule Commonly known as: PRILOSEC Take 20 mg by mouth daily.   PROBIOTIC-10 PO Take 1 tablet by mouth daily.   vitamin B-6 500 MG tablet Take 500 mg by mouth daily.   vitamin C 1000 MG tablet Take 1,000 mg by mouth daily.        Joesph DELENA Sear, PA

## 2024-08-20 ENCOUNTER — Ambulatory Visit (HOSPITAL_COMMUNITY)
Admission: RE | Admit: 2024-08-20 | Discharge: 2024-08-20 | Disposition: A | Discharge status: Home / Self Care | Source: Ambulatory Visit | Attending: Internal Medicine | Admitting: Internal Medicine

## 2024-08-20 VITALS — BP 116/60 | HR 91 | Temp 97.5°F | Resp 16

## 2024-08-20 DIAGNOSIS — O99013 Anemia complicating pregnancy, third trimester: Secondary | ICD-10-CM | POA: Diagnosis not present

## 2024-08-20 MED ORDER — IRON SUCROSE 300 MG IVPB - SIMPLE MED
300.0000 mg | Freq: Once | Status: AC
  Administered 2024-08-20: 300 mg via INTRAVENOUS
  Filled 2024-08-20: qty 300

## 2024-08-20 MED ORDER — ACETAMINOPHEN 325 MG PO TABS
650.0000 mg | ORAL_TABLET | Freq: Once | ORAL | Status: AC
  Administered 2024-08-20: 650 mg via ORAL
  Filled 2024-08-20: qty 2

## 2024-08-20 MED ORDER — SODIUM CHLORIDE 0.9 % IV SOLN: INTRAVENOUS | Status: DC | PRN

## 2024-08-20 MED ORDER — DIPHENHYDRAMINE HCL 25 MG PO CAPS
25.0000 mg | ORAL_CAPSULE | Freq: Once | ORAL | Status: AC
  Administered 2024-08-20: 25 mg via ORAL
  Filled 2024-08-20: qty 1

## 2024-08-20 NOTE — Progress Notes (Signed)
 PATIENT CARE CENTER NOTE     Diagnosis: Anemia complicating pregnancy in third trimester [O99.013]      Provider: Joshua Alan POUR, CNM      Procedure: Venofer  300 mg (dose # 3 of 3)     Note: Patient received Venofer  infusion (dose  # 3 of 3) via PIV. Patient pre-medicated with PO Tylenol  and Benadryl  per order. Patient tolerated infusion well with no adverse reaction. Patient declined to wait for the 30 minute post infusion observation. Vital signs stable. AVS offered but patient declined. Patient alert, oriented and ambulatory at discharge.

## 2024-08-23 DIAGNOSIS — Z23 Encounter for immunization: Secondary | ICD-10-CM | POA: Diagnosis not present

## 2024-08-23 DIAGNOSIS — O368390 Maternal care for abnormalities of the fetal heart rate or rhythm, unspecified trimester, not applicable or unspecified: Secondary | ICD-10-CM | POA: Diagnosis not present

## 2024-08-23 DIAGNOSIS — Z3A34 34 weeks gestation of pregnancy: Secondary | ICD-10-CM | POA: Diagnosis not present

## 2024-08-28 DIAGNOSIS — E162 Hypoglycemia, unspecified: Secondary | ICD-10-CM | POA: Diagnosis not present

## 2024-09-03 ENCOUNTER — Ambulatory Visit (HOSPITAL_COMMUNITY)

## 2024-09-05 ENCOUNTER — Other Ambulatory Visit (HOSPITAL_COMMUNITY): Payer: Self-pay

## 2024-09-06 DIAGNOSIS — Z8632 Personal history of gestational diabetes: Secondary | ICD-10-CM | POA: Diagnosis not present

## 2024-09-06 DIAGNOSIS — Z8759 Personal history of other complications of pregnancy, childbirth and the puerperium: Secondary | ICD-10-CM | POA: Diagnosis not present

## 2024-09-06 DIAGNOSIS — Z3483 Encounter for supervision of other normal pregnancy, third trimester: Secondary | ICD-10-CM | POA: Diagnosis not present

## 2024-09-06 DIAGNOSIS — Z3685 Encounter for antenatal screening for Streptococcus B: Secondary | ICD-10-CM | POA: Diagnosis not present

## 2024-09-06 DIAGNOSIS — Z9884 Bariatric surgery status: Secondary | ICD-10-CM | POA: Diagnosis not present

## 2024-09-06 DIAGNOSIS — Z3A36 36 weeks gestation of pregnancy: Secondary | ICD-10-CM | POA: Diagnosis not present

## 2024-09-07 DIAGNOSIS — Z3685 Encounter for antenatal screening for Streptococcus B: Secondary | ICD-10-CM | POA: Diagnosis not present

## 2024-09-07 LAB — OB RESULTS CONSOLE GBS: GBS: NEGATIVE

## 2024-09-10 ENCOUNTER — Encounter (HOSPITAL_COMMUNITY): Payer: Self-pay | Admitting: Obstetrics

## 2024-09-10 ENCOUNTER — Other Ambulatory Visit: Payer: Self-pay

## 2024-09-10 ENCOUNTER — Inpatient Hospital Stay (HOSPITAL_COMMUNITY)
Admission: AD | Admit: 2024-09-10 | Discharge: 2024-09-12 | DRG: 807 | Disposition: A | Attending: Obstetrics | Admitting: Obstetrics

## 2024-09-10 DIAGNOSIS — Z9884 Bariatric surgery status: Secondary | ICD-10-CM

## 2024-09-10 NOTE — MAU Note (Addendum)
 MAU Triage Note  Patricia Bailey is a 38 y.o. at [redacted]w[redacted]d here in MAU reporting: reports ctx q3-49min, and bloody show. she was 4cm dilated last week. Denies LOF.   Pain score: 5/10 Vitals:   09/10/24 2346  BP: (!) 122/57  Pulse: 79  Resp: 17  Temp: 98.3 F (36.8 C)  SpO2: 98%     FHT: 150  Lab orders placed from triage: mau labor

## 2024-09-11 ENCOUNTER — Inpatient Hospital Stay (HOSPITAL_COMMUNITY): Admitting: Anesthesiology

## 2024-09-11 ENCOUNTER — Encounter (HOSPITAL_COMMUNITY): Payer: Self-pay | Admitting: Obstetrics

## 2024-09-11 DIAGNOSIS — K219 Gastro-esophageal reflux disease without esophagitis: Secondary | ICD-10-CM | POA: Diagnosis not present

## 2024-09-11 DIAGNOSIS — O9962 Diseases of the digestive system complicating childbirth: Secondary | ICD-10-CM | POA: Diagnosis not present

## 2024-09-11 DIAGNOSIS — O99214 Obesity complicating childbirth: Secondary | ICD-10-CM | POA: Diagnosis not present

## 2024-09-11 DIAGNOSIS — D509 Iron deficiency anemia, unspecified: Secondary | ICD-10-CM | POA: Diagnosis not present

## 2024-09-11 DIAGNOSIS — O26893 Other specified pregnancy related conditions, third trimester: Secondary | ICD-10-CM | POA: Diagnosis not present

## 2024-09-11 DIAGNOSIS — O9902 Anemia complicating childbirth: Secondary | ICD-10-CM | POA: Diagnosis not present

## 2024-09-11 DIAGNOSIS — O99844 Bariatric surgery status complicating childbirth: Secondary | ICD-10-CM | POA: Diagnosis not present

## 2024-09-11 DIAGNOSIS — Z3A37 37 weeks gestation of pregnancy: Secondary | ICD-10-CM | POA: Diagnosis not present

## 2024-09-11 LAB — TYPE AND SCREEN
ABO/RH(D): O POS
Antibody Screen: NEGATIVE

## 2024-09-11 LAB — CBC
HCT: 36.2 % (ref 36.0–46.0)
Hemoglobin: 12 g/dL (ref 12.0–15.0)
MCH: 30.2 pg (ref 26.0–34.0)
MCHC: 33.1 g/dL (ref 30.0–36.0)
MCV: 91 fL (ref 80.0–100.0)
Platelets: 158 K/uL (ref 150–400)
RBC: 3.98 MIL/uL (ref 3.87–5.11)
RDW: 13.4 % (ref 11.5–15.5)
WBC: 10.5 K/uL (ref 4.0–10.5)
nRBC: 0 % (ref 0.0–0.2)

## 2024-09-11 LAB — SYPHILIS: RPR W/REFLEX TO RPR TITER AND TREPONEMAL ANTIBODIES, TRADITIONAL SCREENING AND DIAGNOSIS ALGORITHM: RPR Ser Ql: NONREACTIVE

## 2024-09-11 LAB — RUPTURE OF MEMBRANE (ROM)PLUS: Rom Plus: POSITIVE

## 2024-09-11 LAB — POCT FERN TEST: POCT Fern Test: NEGATIVE

## 2024-09-11 MED ORDER — PHENYLEPHRINE 80 MCG/ML (10ML) SYRINGE FOR IV PUSH (FOR BLOOD PRESSURE SUPPORT): 80.0000 ug | PREFILLED_SYRINGE | INTRAVENOUS | Status: DC | PRN

## 2024-09-11 MED ORDER — EPHEDRINE 5 MG/ML INJ: 10.0000 mg | INTRAVENOUS | Status: DC | PRN

## 2024-09-11 MED ORDER — SENNOSIDES-DOCUSATE SODIUM 8.6-50 MG PO TABS
2.0000 | ORAL_TABLET | Freq: Every day | ORAL | Status: DC
  Filled 2024-09-11: qty 2

## 2024-09-11 MED ORDER — LACTATED RINGERS IV SOLN: INTRAVENOUS | Status: DC

## 2024-09-11 MED ORDER — ACETAMINOPHEN 325 MG PO TABS: 650.0000 mg | ORAL_TABLET | ORAL | Status: DC | PRN

## 2024-09-11 MED ORDER — OXYCODONE-ACETAMINOPHEN 5-325 MG PO TABS: 2.0000 | ORAL_TABLET | ORAL | Status: DC | PRN

## 2024-09-11 MED ORDER — SOD CITRATE-CITRIC ACID 500-334 MG/5ML PO SOLN: 30.0000 mL | ORAL | Status: DC | PRN

## 2024-09-11 MED ORDER — DIBUCAINE (PERIANAL) 1 % EX OINT: 1.0000 | TOPICAL_OINTMENT | CUTANEOUS | Status: DC | PRN

## 2024-09-11 MED ORDER — LACTATED RINGERS IV SOLN
500.0000 mL | Freq: Once | INTRAVENOUS | Status: AC
  Administered 2024-09-11: 500 mL via INTRAVENOUS

## 2024-09-11 MED ORDER — SIMETHICONE 80 MG PO CHEW: 80.0000 mg | CHEWABLE_TABLET | ORAL | Status: DC | PRN

## 2024-09-11 MED ORDER — ONDANSETRON HCL 4 MG PO TABS: 4.0000 mg | ORAL_TABLET | ORAL | Status: DC | PRN

## 2024-09-11 MED ORDER — ZOLPIDEM TARTRATE 5 MG PO TABS: 5.0000 mg | ORAL_TABLET | Freq: Every evening | ORAL | Status: DC | PRN

## 2024-09-11 MED ORDER — FLEET ENEMA RE ENEM: 1.0000 | ENEMA | RECTAL | Status: DC | PRN

## 2024-09-11 MED ORDER — LIDOCAINE HCL (PF) 1 % IJ SOLN
INTRAMUSCULAR | Status: DC | PRN
  Administered 2024-09-11 (×2): 4 mL via EPIDURAL

## 2024-09-11 MED ORDER — TERBUTALINE SULFATE 1 MG/ML IJ SOLN: 0.2500 mg | Freq: Once | INTRAMUSCULAR | Status: DC | PRN

## 2024-09-11 MED ORDER — BUPIVACAINE HCL (PF) 0.25 % IJ SOLN
INTRAMUSCULAR | Status: DC | PRN
  Administered 2024-09-11: 1.2 mL via INTRATHECAL

## 2024-09-11 MED ORDER — TETANUS-DIPHTH-ACELL PERTUSSIS 5-2-15.5 LF-MCG/0.5 IM SUSP: 0.5000 mL | Freq: Once | INTRAMUSCULAR | Status: DC

## 2024-09-11 MED ORDER — LIDOCAINE HCL (PF) 1 % IJ SOLN: 30.0000 mL | INTRAMUSCULAR | Status: DC | PRN

## 2024-09-11 MED ORDER — WITCH HAZEL-GLYCERIN EX PADS: 1.0000 | MEDICATED_PAD | CUTANEOUS | Status: DC | PRN

## 2024-09-11 MED ORDER — IBUPROFEN 600 MG PO TABS
600.0000 mg | ORAL_TABLET | Freq: Four times a day (QID) | ORAL | Status: DC
  Administered 2024-09-11 – 2024-09-12 (×5): 600 mg via ORAL
  Filled 2024-09-11 (×5): qty 1

## 2024-09-11 MED ORDER — ONDANSETRON HCL 4 MG/2ML IJ SOLN: 4.0000 mg | INTRAMUSCULAR | Status: DC | PRN

## 2024-09-11 MED ORDER — ONDANSETRON HCL 4 MG/2ML IJ SOLN: 4.0000 mg | Freq: Four times a day (QID) | INTRAMUSCULAR | Status: DC | PRN

## 2024-09-11 MED ORDER — BENZOCAINE-MENTHOL 20-0.5 % EX AERO
1.0000 | INHALATION_SPRAY | CUTANEOUS | Status: DC | PRN
  Administered 2024-09-11: 1 via TOPICAL
  Filled 2024-09-11: qty 56

## 2024-09-11 MED ORDER — LACTATED RINGERS IV SOLN: 500.0000 mL | INTRAVENOUS | Status: DC | PRN

## 2024-09-11 MED ORDER — FENTANYL-BUPIVACAINE-NACL 0.5-0.125-0.9 MG/250ML-% EP SOLN
12.0000 mL/h | EPIDURAL | Status: DC | PRN
  Administered 2024-09-11: 12 mL/h via EPIDURAL
  Filled 2024-09-11: qty 250

## 2024-09-11 MED ORDER — DIPHENHYDRAMINE HCL 25 MG PO CAPS: 25.0000 mg | ORAL_CAPSULE | Freq: Four times a day (QID) | ORAL | Status: DC | PRN

## 2024-09-11 MED ORDER — PRENATAL MULTIVITAMIN CH
1.0000 | ORAL_TABLET | Freq: Every day | ORAL | Status: DC
  Filled 2024-09-11 (×2): qty 1

## 2024-09-11 MED ORDER — COCONUT OIL OIL: 1.0000 | TOPICAL_OIL | Status: DC | PRN

## 2024-09-11 MED ORDER — OXYTOCIN BOLUS FROM INFUSION
333.0000 mL | Freq: Once | INTRAVENOUS | Status: AC
  Administered 2024-09-11: 333 mL via INTRAVENOUS

## 2024-09-11 MED ORDER — OXYCODONE-ACETAMINOPHEN 5-325 MG PO TABS: 1.0000 | ORAL_TABLET | ORAL | Status: DC | PRN

## 2024-09-11 MED ORDER — OXYTOCIN-SODIUM CHLORIDE 30-0.9 UT/500ML-% IV SOLN
1.0000 m[IU]/min | INTRAVENOUS | Status: DC
  Administered 2024-09-11: 2 m[IU]/min via INTRAVENOUS

## 2024-09-11 MED ORDER — ACETAMINOPHEN 325 MG PO TABS
650.0000 mg | ORAL_TABLET | ORAL | Status: DC | PRN
  Administered 2024-09-11 – 2024-09-12 (×5): 650 mg via ORAL
  Filled 2024-09-11 (×5): qty 2

## 2024-09-11 MED ORDER — OXYTOCIN-SODIUM CHLORIDE 30-0.9 UT/500ML-% IV SOLN
2.5000 [IU]/h | INTRAVENOUS | Status: DC
  Filled 2024-09-11: qty 500

## 2024-09-11 MED ORDER — DIPHENHYDRAMINE HCL 50 MG/ML IJ SOLN: 12.5000 mg | INTRAMUSCULAR | Status: DC | PRN

## 2024-09-11 NOTE — Anesthesia Procedure Notes (Signed)
 Combined Spinal Epidural Patient location during procedure: OB Start time: 09/11/2024 6:16 AM End time: 09/11/2024 6:25 AM  Staffing Anesthesiologist: Peggye Delon Brunswick, MD Performed: anesthesiologist   Preanesthetic Checklist Completed: patient identified, IV checked, risks and benefits discussed, monitors and equipment checked, pre-op evaluation and timeout performed  Epidural Patient position: sitting Prep: DuraPrep Patient monitoring: heart rate, continuous pulse ox and blood pressure Approach: midline Location: L3-L4 Injection technique: LOR air  Needle:  Needle type: Pencan  Needle gauge: 25 G Needle type: Tuohy  Needle gauge: 17 G Needle length: 9 cm Needle insertion depth: 7 cm Catheter at skin depth: 11 cm Test dose: negative  Assessment Events: blood not aspirated, injection not painful, no injection resistance, no paresthesia and negative IV test  Additional Notes CSE placed for labor pain management. Risks and benefits including, but not limited to, infection, bleeding, local anesthetic toxicity, headache, hypotension, back pain, block failure, etc. were discussed with the patient. The patient expressed understanding and consented to the procedure. I confirmed that the patient has no bleeding disorders and is not taking blood thinners. I confirmed the patient's last platelet count with the nurse. A time-out was performed immediately prior to the procedure. Please see nursing documentation for vital signs. Sterile technique was used throughout the whole procedure. Once LOR achieved, a 25g Pencan spinal needle was passed easily through the Tuohy needle with return of clear CSF. The spinal dose was administered. The spinal needle was withdrawn, and the epidural catheter threaded easily without resistance. Aspiration of the catheter was negative for blood and CSF. An epidural infusion was started.  2 attempts

## 2024-09-11 NOTE — Anesthesia Preprocedure Evaluation (Signed)
 Anesthesia Evaluation  Patient identified by MRN, date of birth, ID band Patient awake    Reviewed: Allergy & Precautions, NPO status , Patient's Chart, lab work & pertinent test results  History of Anesthesia Complications Negative for: history of anesthetic complications  Airway Mallampati: III  TM Distance: >3 FB Neck ROM: Full    Dental   Pulmonary neg pulmonary ROS   Pulmonary exam normal breath sounds clear to auscultation       Cardiovascular negative cardio ROS  Rhythm:Regular Rate:Normal     Neuro/Psych negative neurological ROS     GI/Hepatic Neg liver ROS,GERD  Medicated,,H/o bariatric surgery   Endo/Other  negative endocrine ROS    Renal/GU negative Renal ROS     Musculoskeletal   Abdominal   Peds  Hematology negative hematology ROS (+) Lab Results      Component                Value               Date                      WBC                      10.5                09/11/2024                HGB                      12.0                09/11/2024                HCT                      36.2                09/11/2024                MCV                      91.0                09/11/2024                PLT                      158                 09/11/2024              Anesthesia Other Findings   Reproductive/Obstetrics (+) Pregnancy                              Anesthesia Physical Anesthesia Plan  ASA: 2  Anesthesia Plan: Epidural   Post-op Pain Management:    Induction:   PONV Risk Score and Plan:   Airway Management Planned: Natural Airway  Additional Equipment:   Intra-op Plan:   Post-operative Plan:   Informed Consent: I have reviewed the patients History and Physical, chart, labs and discussed the procedure including the risks, benefits and alternatives for the proposed anesthesia with the patient or authorized representative who has indicated his/her  understanding and acceptance.       Plan Discussed  with: Anesthesiologist  Anesthesia Plan Comments: (I have discussed risks of neuraxial anesthesia including but not limited to infection, bleeding, nerve injury, back pain, headache, seizures, and failure of block. Patient denies bleeding disorders and is not currently anticoagulated. Labs have been reviewed. Risks and benefits discussed. All patient's questions answered.  )         Anesthesia Quick Evaluation

## 2024-09-11 NOTE — H&P (Signed)
 OB ADMISSION/ HISTORY & PHYSICAL:  Admission Date: 09/10/2024 11:19 PM  Admit Diagnosis: ctx 3-4 min    Patricia Bailey is a 38 y.o. female G4P3003 at [redacted]w[redacted]d presenting for labor. Initially presented to MAU for contractions and then prior to discharge with no cervical change, reported a gush of clear fluid and ROM plus was positive. Reports + bloody show last night. Endorses + fetal movement. Husband, Alm, present and supportive. Eagerly anticipating a baby girl Adalyn.  Prenatal History: [id number]   EDC: 09/29/2024 Prenatal care at [facility] since 10 weeks  Primary: M. Sigmon, CNM  Prenatal course complicated by: History of bariatric surgery, minimal weight gain this pregnancy, TWG 6lbs, AGA growth at last U/S, EFW 6lb 3oz at [redacted]w[redacted]d History of GDM and gHTN, normal labs and BPs this pregnancy Iron  deficiency anemia, multiple doses of IV Venofer  this pregnancy AMA Rubella non-immune  Prenatal Labs: ABO, Rh:   O POS Antibody:   Negative Rubella: Nonimmune (06/30 0000)  RPR: Nonreactive (10/20 0000)  HBsAg: Negative (06/30 0000)  HIV: Non-reactive (06/30 0000)  GBS:   Negative 1 hr Glucola : Declined, normal CBGs since 28 weeks Genetic Screening: Declined Ultrasound: normal XX anatomy, posterior/left lateral placenta, EFW 6lb 3oz, 35% at [redacted]w[redacted]d  Prenatal Transfer Tool  Maternal Diabetes: No Genetic Screening: Declined Maternal Ultrasounds/Referrals: Normal Fetal Ultrasounds or other Referrals:  None Maternal Substance Abuse:  No Significant Maternal Medications:  None Significant Maternal Lab Results: Group B Strep negative Number of Prenatal Visits:greater than 3 verified prenatal visits Maternal Vaccinations:RSV: Given during pregnancy >/=14 days ago and Flu Other Comments:  None   Medical / Surgical History : Past medical history:  Past Medical History:  Diagnosis Date   Diabetes mellitus    Gestational diabetes    glyburide   History of chicken pox     Obesity    Pregnancy induced hypertension     Past surgical history:  Past Surgical History:  Procedure Laterality Date   Sadi Procedure  2022    Family History: History reviewed. No pertinent family history.  Social History:  reports that she has never smoked. She has never used smokeless tobacco. She reports that she does not currently use alcohol. She reports that she does not use drugs.  Allergies: Stadol [butorphanol tartrate]   Current Medications at time of admission:  Medications Prior to Admission  Medication Sig Dispense Refill Last Dose/Taking   omeprazole (PRILOSEC) 20 MG capsule Take 20 mg by mouth daily.   09/10/2024   Ascorbic Acid (VITAMIN C) 1000 MG tablet Take 1,000 mg by mouth daily.      ferrous sulfate 325 (65 FE) MG tablet Take 325 mg by mouth daily with breakfast.      magnesium 30 MG tablet Take 30 mg by mouth daily.       Prenatal Vit-Fe Fumarate-FA (MULTIVITAMIN-PRENATAL) 27-0.8 MG TABS Take 1 tablet by mouth daily.      Probiotic Product (PROBIOTIC-10 PO) Take 1 tablet by mouth daily.      Pyridoxine HCl (VITAMIN B-6) 500 MG tablet Take 500 mg by mouth daily.       Review of Systems: Review of Systems  All other systems reviewed and are negative.  Physical Exam: Vital signs and nursing notes reviewed.  Patient Vitals for the past 24 hrs:  BP Temp Temp src Pulse Resp SpO2 Height Weight  09/10/24 2358 -- -- -- -- -- -- 5' 6 (1.676 m) 81.2 kg  09/10/24 2346 (!) 122/57 98.3  F (36.8 C) Oral 79 17 98 % -- --    General: AAO x 3, NAD Heart: RRR Lungs:CTAB Abdomen: Gravid, NT Extremities: no edema SVE: Dilation: 4.5 Effacement (%): 70 Cervical Position: Middle Station: -2 Presentation: Vertex Exam by:: Oleh Loges RN   FHR: 140BPM, moderate variability, + accels, no decels TOCO: Contractions q 6-7 minutes  Labs:   Recent Labs    09/11/24 0228  WBC 10.5  HGB 12.0  HCT 36.2  PLT 158   Assessment/Plan: 38 y.o. G4P3003 at [redacted]w[redacted]d, early  labor with SROM History of bariatric surgery since last pregnancy, TWG 6lb, AGA at [redacted]w[redacted]d, 6lb 3oz AMA Rubella non-immune  Fetal wellbeing - FHT category 1 EFW AGA 6-7lbs  Labor: Plan expectant management, Pitocin  prn  GBS negative Rubella non-immune Rh positive  Pain control: desires epidural Analgesia/anesthesia PRN  Anticipated MOD: NSVB  Plans to breastfeed. POC discussed with patient and support team, all questions answered.  Dr. Kandyce notified of admission/plan of care.  Alan MARLA Molt CNM, MSN 09/11/2024, 2:43 AM

## 2024-09-11 NOTE — Anesthesia Procedure Notes (Signed)
 Epidural Patient location during procedure: OB Start time: 09/11/2024 3:58 AM End time: 09/11/2024 4:04 AM  Staffing Anesthesiologist: Peggye Delon Brunswick, MD Performed: anesthesiologist   Preanesthetic Checklist Completed: patient identified, IV checked, risks and benefits discussed, monitors and equipment checked, pre-op evaluation and timeout performed  Epidural Patient position: sitting Prep: DuraPrep and site prepped and draped Patient monitoring: continuous pulse ox and blood pressure Approach: midline Location: L3-L4 Injection technique: LOR saline  Needle:  Needle type: Tuohy  Needle gauge: 17 G Needle length: 9 cm and 9 Needle insertion depth: 6 cm Catheter type: closed end flexible Catheter size: 19 Gauge Catheter at skin depth: 10 cm Test dose: negative  Assessment Events: blood not aspirated, no cerebrospinal fluid, injection not painful, no injection resistance, no paresthesia and negative IV test  Additional Notes The patient has requested an epidural for labor pain management. Risks and benefits including, but not limited to, infection, bleeding, local anesthetic toxicity, headache, hypotension, back pain, block failure, etc. were discussed with the patient. The patient expressed understanding and consented to the procedure. I confirmed that the patient has no bleeding disorders and is not taking blood thinners. I confirmed the patient's last platelet count with the nurse. A time-out was performed immediately prior to the procedure. Please see nursing documentation for vital signs. Sterile technique was used throughout the whole procedure. Once LOR achieved, the epidural catheter threaded easily without resistance. Aspiration of the catheter was negative for blood and CSF. The epidural was dosed slowly and an infusion was started.  1 attempt(s)Reason for block:procedure for pain

## 2024-09-11 NOTE — Anesthesia Postprocedure Evaluation (Signed)
 Anesthesia Post Note  Patient: Patricia Bailey  Procedure(s) Performed: AN AD HOC LABOR EPIDURAL     Patient location during evaluation: Mother Baby Anesthesia Type: Epidural Level of consciousness: awake and alert Pain management: pain level controlled Vital Signs Assessment: post-procedure vital signs reviewed and stable Respiratory status: spontaneous breathing, nonlabored ventilation and respiratory function stable Cardiovascular status: stable Postop Assessment: no headache, no backache, no apparent nausea or vomiting, adequate PO intake and able to ambulate Anesthetic complications: no Comments: Epidural did not work per patient. MDA aware.   No notable events documented.  Last Vitals:  Vitals:   09/11/24 0940 09/11/24 1344  BP: 117/67 105/66  Pulse: 60 61  Resp: 18 18  Temp: 36.9 C 36.9 C  SpO2: 100% 98%    Last Pain:  Vitals:   09/11/24 1716  TempSrc:   PainSc: 5    Pain Goal:                   Mckynlie Vanderslice Adedayo Auburn Hester

## 2024-09-11 NOTE — Lactation Note (Signed)
 This note was copied from a baby's chart. Lactation Consultation Note  Patient Name: Patricia Bailey Date: 09/11/2024 Age:38 hours   Mom declines Lactation services.  Maternal Data    Feeding    LATCH Score                    Lactation Tools Discussed/Used    Interventions    Discharge    Consult Status Consult Status: Complete    Takai Chiaramonte G 09/11/2024, 9:57 PM

## 2024-09-12 LAB — CBC
HCT: 31 % — ABNORMAL LOW (ref 36.0–46.0)
Hemoglobin: 10.6 g/dL — ABNORMAL LOW (ref 12.0–15.0)
MCH: 30.8 pg (ref 26.0–34.0)
MCHC: 34.2 g/dL (ref 30.0–36.0)
MCV: 90.1 fL (ref 80.0–100.0)
Platelets: 147 K/uL — ABNORMAL LOW (ref 150–400)
RBC: 3.44 MIL/uL — ABNORMAL LOW (ref 3.87–5.11)
RDW: 13.6 % (ref 11.5–15.5)
WBC: 5.4 K/uL (ref 4.0–10.5)
nRBC: 0 % (ref 0.0–0.2)

## 2024-09-12 MED ORDER — IRON SUCROSE 500 MG IVPB - SIMPLE MED
500.0000 mg | Freq: Once | INTRAVENOUS | Status: DC
  Filled 2024-09-12: qty 275

## 2024-09-12 MED ORDER — GUAIFENESIN-DM 100-10 MG/5ML PO SYRP
5.0000 mL | ORAL_SOLUTION | ORAL | Status: DC | PRN
  Filled 2024-09-12 (×2): qty 5

## 2024-09-12 MED ORDER — SODIUM CHLORIDE 0.9 % IV SOLN
500.0000 mg | Freq: Once | INTRAVENOUS | Status: AC
  Administered 2024-09-12: 500 mg via INTRAVENOUS
  Filled 2024-09-12: qty 25

## 2024-09-12 MED ORDER — SODIUM CHLORIDE 0.9 % IV SOLN: INTRAVENOUS | Status: DC

## 2024-09-12 NOTE — Discharge Summary (Signed)
 Postpartum Discharge Summary  Date of Service updated 12/10.     Patient Name: Patricia Bailey DOB: 1985/12/24 MRN: [id number]  Date of admission: 09/10/2024 Delivery date:09/11/2024 Delivering provider: JOSHUA PALMA K Date of discharge: 09/12/2024  Admitting diagnosis: Normal labor and delivery [O80] Intrauterine pregnancy: [redacted]w[redacted]d     Secondary diagnosis:  Principal Problem:   Postpartum care following vaginal delivery 12/9 Active Problems:   SVD (spontaneous vaginal delivery)   History of bariatric surgery   Normal labor  Additional problems: n/a    Discharge diagnosis: Term Pregnancy Delivered                                              Post partum procedures:IV venofer  Augmentation: Pitocin  Complications: None  Hospital course: Onset of Labor With Vaginal Delivery      38 y.o. yo H5E5995 at [redacted]w[redacted]d was admitted in Latent Labor on 09/10/2024. Labor course was complicated byn/a.  Membrane Rupture Time/Date: 12:00 AM,09/11/2024  Delivery Method:Vaginal, Spontaneous Operative Delivery:N/A Episiotomy: None Lacerations:  None Patient had a postpartum course complicated by anemia, rubella non-immune.  She is ambulating, tolerating a regular diet, passing flatus, and urinating well. Patient is discharged home in stable condition on 09/12/24.  Newborn Data: Birth date:09/11/2024 Birth time:6:47 AM Gender:Female Living status:Living Apgars:9 ,9  Weight:3050 g  Magnesium Sulfate received: No BMZ received: No Rhophylac:No MMR:No offered T-DaP:Given prenatally Flu: No RSV Vaccine received: No Transfusion:No Immunizations administered: Immunization History  Administered Date(s) Administered   PFIZER(Purple Top)SARS-COV-2 Vaccination 04/26/2020   Td 10/04/2008   Tdap 06/02/2020    Physical exam  Vitals:   09/11/24 0940 09/11/24 1344 09/11/24 2045 09/12/24 0500  BP: 117/67 105/66 131/67 (!) 103/56  Pulse: 60 61 (!) 59 (!) 49  Resp: 18 18 18 18   Temp: 98.5 F  (36.9 C) 98.4 F (36.9 C) 98.1 F (36.7 C) 98.3 F (36.8 C)  TempSrc: Oral Oral Axillary Oral  SpO2: 100% 98% 100% 99%  Weight:      Height:        Labs: Lab Results  Component Value Date   WBC 5.4 09/12/2024   HGB 10.6 (L) 09/12/2024   HCT 31.0 (L) 09/12/2024   MCV 90.1 09/12/2024   PLT 147 (L) 09/12/2024      Latest Ref Rng & Units 07/18/2020    7:16 PM  CMP  Glucose 70 - 99 mg/dL 898   BUN 6 - 20 mg/dL 9   Creatinine 9.55 - 8.99 mg/dL 9.41   Sodium 864 - 854 mmol/L 136   Potassium 3.5 - 5.1 mmol/L 3.7   Chloride 98 - 111 mmol/L 108   CO2 22 - 32 mmol/L 18   Calcium 8.9 - 10.3 mg/dL 9.6   Total Protein 6.5 - 8.1 g/dL 5.8   Total Bilirubin 0.3 - 1.2 mg/dL 0.6   Alkaline Phos 38 - 126 U/L 97   AST 15 - 41 U/L 25   ALT 0 - 44 U/L 15    Edinburgh Score:    09/11/2024    8:40 PM  Edinburgh Postnatal Depression Scale Screening Tool  I have been able to laugh and see the funny side of things. --      After visit meds:  Allergies as of 09/12/2024       Reactions   Stadol [butorphanol Tartrate]    Patient states that  stadol makes her itchy and delusional        Medication List     TAKE these medications    ferrous sulfate 325 (65 FE) MG tablet Take 325 mg by mouth daily with breakfast.   magnesium 30 MG tablet Take 30 mg by mouth daily.   multivitamin-prenatal 27-0.8 MG Tabs tablet Take 1 tablet by mouth daily.   omeprazole 20 MG capsule Commonly known as: PRILOSEC Take 20 mg by mouth daily.   PROBIOTIC-10 PO Take 1 tablet by mouth daily.   vitamin B-6 500 MG tablet Take 500 mg by mouth daily.   vitamin C 1000 MG tablet Take 1,000 mg by mouth daily.         Discharge home in stable condition Infant Feeding: Bottle Infant Disposition:home with mother Discharge instruction: per After Visit Summary and Postpartum booklet. Activity: Advance as tolerated. Pelvic rest for 6 weeks.  Diet: routine diet and iron  rich diet Anticipated  Birth Control: Unsure Postpartum Appointment:6 weeks Additional Postpartum F/U: CBC for chronic anemia Future Appointments:No future appointments. Follow up Visit:  Follow-up Information     Joshua Alan POUR, CNM Follow up in 6 week(s).   Specialty: Certified Nurse Midwife Contact information: [address] [place] [address] [phone]                     09/12/2024 Sherrilee KANDICE Both, CNM

## 2024-09-12 NOTE — Progress Notes (Addendum)
° °  PPD #1 S/P NSVD  Live born female  Birth Weight: 6 lb 11.6 oz (3050 g) APGAR: 9, 9  Newborn Delivery   Birth date/time: 09/11/2024 06:47:10 Delivery type: Vaginal, Spontaneous    Baby name: Adalyn  Delivering provider: JOSHUA PALMA K  Lacerations: None  Pain control at delivery: Epidural  S:  Reports feeling well overall. Reporting back pain from failed epidural             Tolerating PO/No nausea or vomiting             Bleeding is light             Pain controlled with acetaminophen  and ibuprofen  (OTC)             Up ad lib/ambulatory/voiding without difficulties   O:  A & O x 3, in no apparent distress              VS:  Vitals:   09/11/24 0940 09/11/24 1344 09/11/24 2045 09/12/24 0500  BP: 117/67 105/66 131/67 (!) 103/56  Pulse: 60 61 (!) 59 (!) 49  Resp: 18 18 18 18   Temp: 98.5 F (36.9 C) 98.4 F (36.9 C) 98.1 F (36.7 C) 98.3 F (36.8 C)  TempSrc: Oral Oral Axillary Oral  SpO2: 100% 98% 100% 99%  Weight:      Height:        LABS:  Recent Labs    09/11/24 0228 09/12/24 0514  WBC 10.5 5.4  HGB 12.0 10.6*  HCT 36.2 31.0*  PLT 158 147*    Blood type: --/--/O POS (12/09 0228)  Rubella: Nonimmune (06/30 0000)   I&O: I/O last 3 completed shifts: In: -  Out: 550 [Urine:400; Blood:150]          No intake/output data recorded.  Gen: AAO x 3, NAD Abdomen: soft, non-tender, non-distended Fundus: firm, non-tender, U-1 Perineum: Appropriately tender Lochia: Minimal Extremities: Mild edema, no calf pain or tenderness   A/P:  PPD # 1 38 y.o., [id number]   Principal Problem:   Postpartum care following vaginal delivery 12/9 Active Problems:   SVD (spontaneous vaginal delivery)   History of bariatric surgery   Normal labor    Doing well - stable status PPD1.  RNI, vaccine offered.  Anemia of pregnancy, IV Venofer  planned today.   H/o bariatric surgery, low milk supply. Plans to bottle feed, lactation support as desired.   Routine post partum orders  and precautions given, to see lactation as desired. Recommended heat compress and NSAID's for back pain from failed epidural.   Anticipate discharge home today if infant cleared by peds.    Sherrilee KANDICE Both, MSN, CNM 09/12/2024, 7:35 AM

## 2024-09-19 ENCOUNTER — Telehealth (HOSPITAL_COMMUNITY): Payer: Self-pay | Admitting: *Deleted

## 2024-09-19 NOTE — Telephone Encounter (Signed)
 Attempted hospital discharge follow-up phone call. Message received stating, The mailbox is full and cannot accept any new messages. Allean IVAR Carton, RN, 09/19/24, [phone]

## 2024-09-22 ENCOUNTER — Inpatient Hospital Stay (HOSPITAL_COMMUNITY)

## 2024-11-12 ENCOUNTER — Ambulatory Visit: Payer: Self-pay | Admitting: Podiatry
